# Patient Record
Sex: Male | Born: 1960 | Race: White | Hispanic: No | Marital: Married | Smoking: Current every day smoker
Health system: Southern US, Community
[De-identification: ages and names within clinical notes are randomized; demographics above are authoritative.]

## PROBLEM LIST (undated history)

## (undated) DIAGNOSIS — K589 Irritable bowel syndrome without diarrhea: Secondary | ICD-10-CM

## (undated) DIAGNOSIS — I10 Essential (primary) hypertension: Secondary | ICD-10-CM

## (undated) DIAGNOSIS — K635 Polyp of colon: Secondary | ICD-10-CM

## (undated) DIAGNOSIS — K219 Gastro-esophageal reflux disease without esophagitis: Secondary | ICD-10-CM

## (undated) HISTORY — DX: Gastro-esophageal reflux disease without esophagitis: K21.9

## (undated) HISTORY — DX: Essential (primary) hypertension: I10

## (undated) HISTORY — DX: Irritable bowel syndrome without diarrhea: K58.9

---

## 1998-10-15 ENCOUNTER — Inpatient Hospital Stay (HOSPITAL_COMMUNITY): Admission: EM | Admit: 1998-10-15 | Discharge: 1998-10-15 | Payer: Self-pay | Admitting: Emergency Medicine

## 1998-10-19 ENCOUNTER — Encounter: Admission: RE | Admit: 1998-10-19 | Discharge: 1998-10-19 | Payer: Self-pay | Admitting: Hematology and Oncology

## 1998-10-24 ENCOUNTER — Ambulatory Visit (HOSPITAL_COMMUNITY): Admission: RE | Admit: 1998-10-24 | Discharge: 1998-10-24 | Payer: Self-pay | Admitting: Internal Medicine

## 2005-07-24 ENCOUNTER — Ambulatory Visit: Payer: Self-pay | Admitting: Internal Medicine

## 2005-07-31 ENCOUNTER — Ambulatory Visit (HOSPITAL_COMMUNITY): Admission: RE | Admit: 2005-07-31 | Discharge: 2005-07-31 | Payer: Self-pay | Admitting: Internal Medicine

## 2005-07-31 ENCOUNTER — Encounter: Payer: Self-pay | Admitting: Internal Medicine

## 2005-07-31 ENCOUNTER — Ambulatory Visit: Payer: Self-pay | Admitting: Internal Medicine

## 2005-07-31 HISTORY — PX: COLONOSCOPY: SHX174

## 2005-07-31 HISTORY — PX: ESOPHAGOGASTRODUODENOSCOPY: SHX1529

## 2005-10-31 ENCOUNTER — Ambulatory Visit: Payer: Self-pay | Admitting: Internal Medicine

## 2005-11-14 ENCOUNTER — Encounter: Payer: Self-pay | Admitting: Emergency Medicine

## 2006-04-29 ENCOUNTER — Ambulatory Visit: Payer: Self-pay | Admitting: Internal Medicine

## 2007-05-16 ENCOUNTER — Ambulatory Visit: Payer: Self-pay | Admitting: Internal Medicine

## 2007-07-17 HISTORY — PX: KNEE SURGERY: SHX244

## 2008-03-01 ENCOUNTER — Emergency Department (HOSPITAL_COMMUNITY): Admission: EM | Admit: 2008-03-01 | Discharge: 2008-03-01 | Payer: Self-pay | Admitting: Emergency Medicine

## 2008-03-01 ENCOUNTER — Encounter: Payer: Self-pay | Admitting: Orthopedic Surgery

## 2008-03-03 ENCOUNTER — Ambulatory Visit: Payer: Self-pay | Admitting: Orthopedic Surgery

## 2008-03-03 DIAGNOSIS — S83509A Sprain of unspecified cruciate ligament of unspecified knee, initial encounter: Secondary | ICD-10-CM | POA: Insufficient documentation

## 2008-03-03 DIAGNOSIS — M25469 Effusion, unspecified knee: Secondary | ICD-10-CM

## 2008-03-04 ENCOUNTER — Telehealth: Payer: Self-pay | Admitting: Orthopedic Surgery

## 2008-03-08 ENCOUNTER — Encounter: Payer: Self-pay | Admitting: Orthopedic Surgery

## 2008-03-12 ENCOUNTER — Encounter: Payer: Self-pay | Admitting: Orthopedic Surgery

## 2008-03-17 ENCOUNTER — Ambulatory Visit: Payer: Self-pay | Admitting: Orthopedic Surgery

## 2008-03-17 DIAGNOSIS — M23302 Other meniscus derangements, unspecified lateral meniscus, unspecified knee: Secondary | ICD-10-CM | POA: Insufficient documentation

## 2008-03-18 ENCOUNTER — Telehealth: Payer: Self-pay | Admitting: Orthopedic Surgery

## 2008-03-18 ENCOUNTER — Encounter: Payer: Self-pay | Admitting: Orthopedic Surgery

## 2008-03-23 ENCOUNTER — Encounter: Payer: Self-pay | Admitting: Orthopedic Surgery

## 2008-03-23 ENCOUNTER — Telehealth: Payer: Self-pay | Admitting: Orthopedic Surgery

## 2008-03-26 ENCOUNTER — Ambulatory Visit: Payer: Self-pay | Admitting: Orthopedic Surgery

## 2008-03-26 ENCOUNTER — Ambulatory Visit (HOSPITAL_COMMUNITY): Admission: RE | Admit: 2008-03-26 | Discharge: 2008-03-26 | Payer: Self-pay | Admitting: Orthopedic Surgery

## 2008-03-30 ENCOUNTER — Ambulatory Visit: Payer: Self-pay | Admitting: Orthopedic Surgery

## 2008-04-05 ENCOUNTER — Encounter: Payer: Self-pay | Admitting: Orthopedic Surgery

## 2008-04-19 ENCOUNTER — Ambulatory Visit: Payer: Self-pay | Admitting: Orthopedic Surgery

## 2008-04-20 ENCOUNTER — Encounter: Payer: Self-pay | Admitting: Orthopedic Surgery

## 2008-04-20 ENCOUNTER — Encounter (HOSPITAL_COMMUNITY): Admission: RE | Admit: 2008-04-20 | Discharge: 2008-05-20 | Payer: Self-pay | Admitting: Orthopedic Surgery

## 2008-04-27 ENCOUNTER — Encounter: Payer: Self-pay | Admitting: Orthopedic Surgery

## 2008-05-06 ENCOUNTER — Telehealth: Payer: Self-pay | Admitting: Orthopedic Surgery

## 2008-05-07 ENCOUNTER — Encounter: Payer: Self-pay | Admitting: Orthopedic Surgery

## 2008-05-10 ENCOUNTER — Ambulatory Visit: Payer: Self-pay | Admitting: Orthopedic Surgery

## 2008-05-14 ENCOUNTER — Encounter: Payer: Self-pay | Admitting: Orthopedic Surgery

## 2008-06-03 ENCOUNTER — Encounter: Payer: Self-pay | Admitting: Orthopedic Surgery

## 2010-11-28 NOTE — Assessment & Plan Note (Signed)
NAMEMarland Flores  DELSIN, COPEN                  CHART#:  [id number]   DATE:  05/16/2007                       DOB:  1960/10/26   Followup gastroesophageal reflux disease, last seen 04/29/2006.  The  patient has done well.  He has been able to lose some weight.  He is now  down to 244.  He was 253 when he was seen originally 10/31/2005.  He has  basically stopped taking AcipHex and uses Tums rarely for occasional  reflux.  He has learned to avoid things like coffee, candy and cinnamon,  and he ends up doing very well.  He is avoiding late night meals.   He is not having any dysphagia.  He underwent a colonoscopy back in  January of 2007 for hematochezia.  Found to have some colonic adenomas,  which were removed.  He is due for surveillance in 2012.  He has not  seen Dr. Ouida Sills very recently.   CURRENT MEDICATIONS:  Altace 10 mg daily.   ALLERGIES:  Some type of pain medication.   EXAM:  Looks well.  Weight 244, height 6 feet 1 inch, temperature 97.9,  BP 140/90, pulse 64.  Skin warm and dry.  Chest and lungs, clear to  auscultation.  Cardiac exam is regular rate and rhythm without murmur,  gallop, or rub.  Abdomen with positive bowel sounds.  Soft and nontender  without palpable mass or organomegaly.   ASSESSMENT:  Gastroesophageal reflux disease.  Symptoms are now  quiescent with weight loss and dietary modification.  He is not on  aspiration therapy.  He is overall doing very well.  History of colonic  adenomas, due for surveillance 2012.   RECOMMENDATIONS:  I told Mr. Vandenberghe it would be great if he could lose  another 10 pounds over the next year.  This will help sustain a  remission in his reflux disease.  Also would help some blood pressure.  I have recommended he have an over-the-counter agent for occasional  reflux as needed on hand.  Otherwise, we do not need to do anything else  unless he has  recurrent problems.  I have put him on the computer calendar for a call  back in 2012 for a  colonoscopy.  No schedule followup need at this time  otherwise.       Jonathon Bellows, M.D.  Electronically Signed     RMR/MEDQ  D:  05/16/2007  T:  05/16/2007  Job:  [id number]   cc:   Kingsley Callander. Ouida Sills, MD

## 2010-11-28 NOTE — Op Note (Signed)
Shane Flores, Shane Flores                 ACCOUNT NO.:  [id number]   MEDICAL RECORD NO.:  [id number]          PATIENT TYPE:  AMB   LOCATION:  DAY                           FACILITY:  [facility]   PHYSICIAN:  Vickki Hearing, M.D.DATE OF BIRTH:  1961/03/05   DATE OF PROCEDURE:  DATE OF DISCHARGE:                               OPERATIVE REPORT   CHIEF COMPLAINT:  Pain in Shane Flores left knee.   HISTORY:  He is a 50 year old male who was injured at work on March 01, 2008, presented to my office on March 03, 2008.   MECHANISM OF INJURY:  He slipped on a step and knee popped and cracked  and had immediate swelling.  He was seen in the hospital, had x-rays.  They were negative.  He was treated with crutches, ice, ibuprofen, and  Tylenol.  I saw, he had a limp and a large effusion that was aspirated.  He was sent for an MRI.  Shane Flores MRI showed a torn medial meniscus, some  arthritis of the knee.   Shane Flores mechanical symptoms warranted recommendation of surgery.  He  accepted risks and benefits of procedure.   PREOPERATIVE DIAGNOSIS:  Torn medial meniscus, left knee.   POSTOPERATIVE DIAGNOSIS:  Torn medial meniscus, left knee.   PROCEDURES:  1. Arthroscopy, left knee.  2. Partial medial meniscectomy.   SURGEON:  Vickki Hearing, MD.   ASSISTANTS:  None.   ANESTHETIC:  General with LMA.   OPERATIVE FINDINGS:  He had a 2 bucket-handle tear of Shane Flores medial  meniscus with the posterior horn flipped, but still attached to its  meniscal anatomic sites.  It was flipped into the anterior portion and  notch.  ACL was intact.  Lateral meniscus intact.  Patellofemoral joint  had mild chondromalacia, grade 1.  Medial femoral condyle had mild  chondromalacia, grade 1.  Lateral compartment normal.   No specimens.   BLOOD LOSS:  Minimal.   COMPLICATIONS:  None.   COUNTS:  Correct.   The patient was identified preop in the holding area, site marking by  the patient and surgeon on the left knee.  History  and physical updated.  The patient was started on a gram of Ancef, brought to surgery, had LMA  anesthetic.  Left knee prep with DuraPrep, draped sterilely.   Time-out procedure completed.   The patient's knee injected with Marcaine with dilute epinephrine  solution.  Two portals were established, one medial, one lateral.  Scope  was placed lateral.  Diagnostic arthroscopy was completed.  The anterior  portion of the bucket-handle was detached from the remaining anterior  meniscus with a straight scissors punch.  The remaining meniscal  fragment was resected with a motorized shaver to the posterior portion.  This portion was then reduced back into the posterior horn and then  resected with a combination of duckbill forceps, motorized shaver, and  90-degree ArthroCare wand.   A probe was then placed in the joint.  Remaining meniscal rim was  stable.  ACL was palpated stable.  Lateral meniscus palpated stable.  Patellofemoral joint had mild  chondromalacia.   The knee was irrigated, injected with 20 mL of Marcaine with epinephrine  solution.  Portal sites closed with 3-0 nylon.  Dressings and Cryo/Cuff  applied.  The patient was extubated, taken to recovery room in stable  condition.   POSTOPERATIVE PLAN:  The patient will be discharged on Norco 7.5 mg/325  mg 1 q. 4 p.r.n. for pain.  We will give him some Phenergan in case he  gets nauseous 25 mg p.o. q. 6 p.r.n. for pain.  We will give 60 tablets  of Norco with 2 refills and 40 tablets of Phenergan, no refills.  The  patient will follow up with me on Tuesday, March 30, 2008 at 2 p.m.      Vickki Hearing, M.D.  Electronically Signed     SEH/MEDQ  D:  03/26/2008  T:  03/26/2008  Job:  [id number]

## 2010-11-28 NOTE — H&P (Signed)
Shane Flores, Shane Flores                 ACCOUNT NO.:  [id number]   MEDICAL RECORD NO.:  [id number]          PATIENT TYPE:  AMB   LOCATION:  DAY                           FACILITY:  [facility]   PHYSICIAN:  Vickki Hearing, M.D.DATE OF BIRTH:  19-Feb-1961   DATE OF ADMISSION:  DATE OF DISCHARGE:  LH                              HISTORY & PHYSICAL   CHIEF COMPLAINT:  Pain, left knee.   HISTORY:  This patient was injured at work.  He is 50 years old.  He  slipped on a step.  His knee popped and cracked and he had immediate  swelling.  He went to the hospital for x-rays and they were negative.  He was treated with crutches, ice, ibuprofen and Tylenol.   He presented to [place] on August 19 with the date of injuries noted as  March 01, 2008.  He has no previous surgery on his knee.  No previous  symptoms.  He was walking at that time with a limp and a large joint  effusion.   We went ahead and sent him for an MRI.  The MRI was done and showed that  he had a torn medial meniscus.  There was some arthritis in the knee  which is unrelated to his acute injury.  Additional findings which are  chronic are quadriceps and patellar tendinosis, patellofemoral  chondromalacia, chondromalacia of the posterior medial tibial plateau.  The bucket handle tear appears to be acute.   ALLERGIES:  He has no known allergies.   FAMILY HISTORY:  He has a family history of coronary artery disease,  asthma.   SOCIAL HISTORY:  He is married.  He smokes half pack of cigarettes per  day.  He drinks two cups of coffee per day.  No alcohol use.   REVIEW OF SYSTEMS:  His review of systems is consistent with the  following:  Negative findings under general, cardiac, respiratory, GI,  GU, neuro, endocrine, psych, derm, ENT, immunologic and lymphatic.   PHYSICAL EXAMINATION:  VITAL SIGNS:  Vital signs are stable.  Development is normal.  Nutrition and grooming are normal.  Body habitus  mesomorphic.  Deformities none.  CARDIOVASCULAR:  Observation and palpation normal.  LYMPH NODES:  Normal.  SKIN:  Normal to palpation and inspection.  NEURO:  Coordination normal.  Reflexes normal.  Sensation normal.  PSYCHOLOGICAL:  Alert and oriented x3.  Mood normal.  Affect normal.  MUSCULOSKELETAL:  Gait - abnormal gait pattern consistent with injury as  he was using crutches and not bearing weight initially.  He had a large  knee joint effusion, peripheral joint line tenderness along the medial  side.  There was some mild laxity in the ACL but there was a firm  endpoint and no pivot shift.  Collaterals were stable.  Meniscal signs  eventually became demonstrable as positive with McMurray's maneuver.  His range of motion improved from 45 degrees on initial presentation to  over 100 degrees on final presentation before surgery.   DIAGNOSIS:  Torn medial meniscus, derangement meniscus, 717.5.  MRI was  done at [facility].  MRI reviewed.  Torn bucket handle medial meniscus,  osteoarthritis noted.   ASSESSMENT:  Torn medial meniscus, left knee.   PLAN:  Arthroscopy, left knee, partial medial meniscectomy.   Informed consent process.  I have discussed the procedure with the  patient.  I answered his questions.  Risk of bleeding, infection,  neurovascular injury were discussed.  Diagnosis and reason for surgery  has been explained as a torn medial meniscus with underlying arthritis.  Underlying arthritis has been explained as nonacute and meniscal tear as  acute.  The patient demonstrated understanding of this discussion.  Specific to this procedure, risks include pain, stiffness, swelling.  Surgery on March 26, 2008.  Worker[employer] Astr[employer] has  approved this surgery.  The patient is out of work until October 12.   The patient's followup will be Tuesday, March 30, 2008, at 2 p.m.      Vickki Hearing, M.D.  Electronically Signed     SEH/MEDQ  D:  03/25/2008  T:  03/25/2008  Job:  [id number]

## 2010-12-01 NOTE — Op Note (Signed)
NAMEEDMUND, HOLCOMB                 ACCOUNT NO.:  [id number]   MEDICAL RECORD NO.:  [id number]          PATIENT TYPE:  AMB   LOCATION:  DAY                           FACILITY:  [facility]   PHYSICIAN:  R. Roetta Sessions, M.D. DATE OF BIRTH:  1961/01/14   DATE OF PROCEDURE:  07/31/2005  DATE OF DISCHARGE:                                 OPERATIVE REPORT   PROCEDURE:  Esophagogastroduodenoscopy with biopsy followed by colonoscopy  and snare polypectomy.   INDICATIONS FOR PROCEDURE:  The patient is a 50 year old gentleman with  Hemoccult positive stool on digital rectal exam. Also has had hematochezia  at least one episode. Also having chronic gastroesophageal reflux disease  symptoms. EGD and colonoscopy are now being done. This approach has been  discussed with the patient at length. Potential risks, benefits, and  alternatives have been reviewed.   There is no family history of colorectal neoplasia.   INSTRUMENT:  Olympus video chip system.   Conscious sedation with IV Versed and Demerol in incremental doses.   FINDINGS:  Examination of the tubular esophagus revealed no mucosal  abnormalities. EG junction easily traversed.   Stomach:  Gastric cavity was empty and insufflated well with air. Thorough  examination of gastric mucosa including retroflexed view of the proximal  stomach and esophagogastric junction demonstrated no abnormalities. Pylorus  patent and easily traversed. Examination of bulb and second portion revealed  multiple posterior bulbar erosions straddling the junction between D1 and  D2. There was no frank ulcer crater seen.   THERAPEUTIC/DIAGNOSTIC MANEUVERS:  Esophageal mucosa was biopsied midway and  distally for histological study.   The patient tolerated the procedure well and was prepared for colonoscopy.  Digital rectal revealed no abnormalities.   ENDOSCOPIC FINDINGS:  Prep was adequate   Rectum:  Examination of the rectal mucosa including retroflexed view  of the  anal verge revealed no abnormalities aside from some minimal anal  hemorrhoids.   Colon:  Colonic mucosa was surveyed from the rectosigmoid junction through  the left, transverse, and right colon to the area of the appendiceal  orifice, ileocecal valve, and cecum. These structures were well seen and  photographed for the record. From this level, the scope was slowly  withdrawn, and all previously mentioned mucosal surfaces were again seen.  Once again, the patient had a 1-cm pedunculated polyp at 35 cm. There was a  second 7-mm polyp at 25 cm. The remainder of the colonic mucosa appeared  normal. Both of these polyps were resected with snare cautery and recovered  through the scope. The patient tolerated both procedures well and was  reactive to endoscopy.   IMPRESSION:  Esophagogastroduodenoscopy:  Normal esophageal mucosa status  post biopsy. Normal stomach. Bulbar and proximal D2 erosions. Otherwise  normal D1 and D2.   Colonoscopy findings:  Anal canal hemorrhoids. Polyps at 20 and 35 cm,  removed with snare cautery. The remainder of the colonic mucosa appeared  normal. I suspect the patient has bled frankly from hemorrhoids although the  polyp at 35 cm could conceivably have produced Hemoccult-positive stool.   RECOMMENDATIONS:  1.  No aspirin or arthritis medications for the next 10 days.  2.  Gastroesophageal reflux disease literature provided to Mr. Lafauci.  3.  Three-month course of Aciphex 20 mg orally daily. He should take the      medication every day. Hemorrhoid literature provided to Mr. Galan.  4.  No aspirin or arthritis medications for the next 10 days.  5.  Follow up on pathology.  6.  Ten-day course of  Anusol suppositories one per rectum at bedtime.  7.  Further recommendations to follow.      Jonathon Bellows, M.D.  Electronically Signed     RMR/MEDQ  D:  07/31/2005  T:  07/31/2005  Job:  [id number]   cc:   Kingsley Callander. Ouida Sills, MD  Fax: [phone]

## 2010-12-01 NOTE — Consult Note (Signed)
Shane Flores                 ACCOUNT NO.:  [id number]   MEDICAL RECORD NO.:  [id number]          PATIENT TYPE:   LOCATION:                                 FACILITY:   PHYSICIAN:  Lionel December, M.D.    DATE OF BIRTH:  1960/08/09   DATE OF CONSULTATION:  07/24/2005  DATE OF DISCHARGE:                                   CONSULTATION   REFERRING PHYSICIAN:  Kingsley Callander. Ouida Sills, M.D.   REASON FOR CONSULTATION:  Heme positive stool and chronic GERD.   HISTORY OF PRESENT ILLNESS:  Shane Flores is a 50 year old, Caucasian gentleman who  presented today for further evaluation of Hemoccult positive stool and  gastroesophageal reflux disease.  He recently had his yearly physical with  Dr. Ouida Sills.  On rectal examination, he was found to have heme positive stool.  His bowel movements are usually regular.  Recently, he did note bright red  blood per rectum on the toilet tissue.  He has had intermittent epigastric  pain as well as heartburn.  He has a history of gastroesophageal reflux  disease of over 5 years.  Previously, he had been on Nexium, but it seemed  to not work more recently.  Currently, he is not on anything.  He is  watching is diet closely and takes occasional Tums.  He denies any dysphagia  or odynophagia.  His weight is up 25 pounds and he is trying to lose some  weight.  He has never had a colonoscopy or EGD.   CURRENT MEDICATIONS:  Altace 10 mg daily.   ALLERGIES:  No known drug allergies.   PAST MEDICAL HISTORY:  1.  Gastroesophageal reflux disease.  2.  Hypertension.  3.  Irritable bowel syndrome .  4.  Hemorrhoids.   FAMILY HISTORY:  Mother has hypertension.  Father is deceased at age 51 and  had an MI.  No family history of colorectal cancer or chronic GI illness.   SOCIAL HISTORY:  He is married and has four children.  He is employed with  [employer] as a Naval architect.  He smokes 2-3 cigarettes daily.  Denies any  alcohol use.   REVIEW OF SYSTEMS:  GASTROINTESTINAL:   See HPI.  CONSTITUTIONAL:  Weight  gain as outlined above.  CARDIOPULMONARY:  No chest pain or shortness of  breath.   PHYSICAL EXAMINATION:  VITAL SIGNS:  Weight 255.5 pounds, height 6 feet.  Temperature 98, blood pressure 132/96, pulse 84.  GENERAL:  Pleasant, obese, Caucasian male in no acute distress.  SKIN:  Warm and dry, no jaundice.  HEENT:  Pupils equal round and reactive to light.  Conjunctivae are pink.  Sclerae nonicteric.  Oropharyngeal mucosa are moist and pink.  No lesions,  erythema or exudate.  No lymphadenopathy or thyromegaly.  CHEST:  Lungs clear to auscultation.  CARDIAC:  Regular rate and rhythm with normal S1, S2.  No murmurs, rubs or  gallops.  ABDOMEN:  Positive bowel sounds, obese, but symmetrical.  Soft.  Mild  epigastric tenderness to deep palpation.  No organomegaly or masses.  No  rebound tenderness  or guarding.  No abdominal bruits or hernias.  EXTREMITIES:  No edema.   IMPRESSION:  Shane Flores is a 50 year old, Caucasian gentleman with recent  Hemoccult positive stool on digital rectal examination.  He has since had  one episode of bright red blood per rectum.  Suspect rectal bleeding due to  benign rectoanal source such as anal fissure or hemorrhoids.  He also has  chronic gastroesophageal reflux disease and has intermittent heartburn and  epigastric pain.  He has never had an esophagogastroduodenoscopy.   RECOMMENDATIONS:  1.  I recommend colonoscopy and EGD at this time for further evaluation of      heme positive stool and to rule out complications of chronic GERD such      as Barrett's esophagus.  2.  Colonoscopy and EGD in the near future.  3.  Further recommendations to follow.   I would like to thank Dr. Carylon Perches for allowing [place] to take part in the care  of this patient.      Tana Coast, P.A.      Lionel December, M.D.  Electronically Signed    LL/MEDQ  D:  07/24/2005  T:  07/24/2005  Job:  [id number]

## 2011-04-05 ENCOUNTER — Encounter: Payer: Self-pay | Admitting: Internal Medicine

## 2011-04-18 LAB — BASIC METABOLIC PANEL
BUN: 10
CO2: 31
Calcium: 9.2
Chloride: 101
Creatinine, Ser: 0.91
GFR calc Af Amer: >60
GFR calc non Af Amer: >60
Glucose, Bld: 95
Potassium: 4.2
Sodium: 137

## 2011-04-18 LAB — HEMOGLOBIN AND HEMATOCRIT, BLOOD: HCT: 49.8

## 2011-07-18 ENCOUNTER — Encounter: Payer: Self-pay | Admitting: Internal Medicine

## 2011-07-24 ENCOUNTER — Telehealth: Payer: Self-pay

## 2011-07-24 NOTE — Telephone Encounter (Signed)
Pt referred from Dr. Ouida Sills for screening colonoscopy. He will be 51 on [birth year]06/2010. He wants to schedule after that. I told him I will call him back end of Jan or 1st of Feb to schedule after his birthday. He said that is fine. He is on my call back list.

## 2011-08-07 ENCOUNTER — Telehealth: Payer: Self-pay

## 2011-08-07 NOTE — Telephone Encounter (Signed)
LMOM to call. Needs OV prior to colonoscopy. Hx adenomas) Pt wants colonoscopy scheduled after his birthday.

## 2011-08-13 ENCOUNTER — Telehealth: Payer: Self-pay

## 2011-08-13 NOTE — Telephone Encounter (Signed)
Called [phone] and was told this person does not live at that number. Will mail a letter for pt to call.

## 2011-08-13 NOTE — Telephone Encounter (Signed)
Called emergency contact [phone] and left message on machine for pt to call. Mailed letter to call.

## 2011-08-21 ENCOUNTER — Encounter: Payer: Self-pay | Admitting: Internal Medicine

## 2011-08-23 ENCOUNTER — Encounter: Payer: Self-pay | Admitting: Gastroenterology

## 2011-08-23 ENCOUNTER — Ambulatory Visit (INDEPENDENT_AMBULATORY_CARE_PROVIDER_SITE_OTHER): Payer: BC Managed Care – PPO | Admitting: Gastroenterology

## 2011-08-23 VITALS — BP 133/75 | HR 105 | Temp 98.2°F | Ht 73.0 in | Wt 262.0 lb

## 2011-08-23 DIAGNOSIS — Z860100 Personal history of colon polyps, unspecified: Secondary | ICD-10-CM | POA: Insufficient documentation

## 2011-08-23 DIAGNOSIS — D369 Benign neoplasm, unspecified site: Secondary | ICD-10-CM | POA: Insufficient documentation

## 2011-08-23 MED ORDER — PEG-KCL-NACL-NASULF-NA ASC-C 100 G PO SOLR: 1.0000 | Freq: Once | ORAL | Status: DC

## 2011-08-23 NOTE — Patient Instructions (Signed)
We have set you up for a colonoscopy with Dr. Jena Gauss.  Further recommendations to follow once this is completed.   HAPPY BIRTHDAY!!!!!

## 2011-08-23 NOTE — Assessment & Plan Note (Signed)
51 year old with hx of tubular adenomas in 2007, due for surveillance colonoscopy. No lower GI symptoms at all. Doing well, agreeable to proceed with procedure in near future.  Proceed with TCS with Dr. Jena Gauss in near future: the risks, benefits, and alternatives have been discussed with the patient in detail. The patient states understanding and desires to proceed.

## 2011-08-23 NOTE — Progress Notes (Signed)
Primary Care Physician:  Shane Perches, MD, MD Primary Gastroenterologist:  Dr. Jena Gauss  Chief Complaint  Patient presents with  . Colonoscopy    HPI:   Shane Flores presents today for a visit prior to surveillance colonoscopy. Hx of adenomatous polyps in 2007, due for repeat now. Denies any abdominal pain, rectal bleeding, loss of appetite, weight loss, change in bowel habits. Has no upper GI symptoms. Very rare reflux if at all. He has been watching what he eats. No dysphagia.  EGD on file from 2007 as outlined below.   Past Medical History  Diagnosis Date  . GERD (gastroesophageal reflux disease)   . HTN (hypertension)   . IBS (irritable bowel syndrome)   . Hemorrhoids     Past Surgical History  Procedure Date  . Colonoscopy 07/31/05    anal canal hemorrhoids, tubular adenomas  . Esophagogastroduodenoscopy 07/31/05    normal esophagus, bulbar and proximal D2 erosions, , path: reflux esophagitis  . Knee surgery 2009    left    Current Outpatient Prescriptions  Medication Sig Dispense Refill  . peg 3350 powder (MOVIPREP) 100 G SOLR Take 1 kit (100 g total) by mouth once. As directed Please purchase 1 Fleets enema to use with the prep  1 kit  0    Allergies as of 08/23/2011  . (No Known Allergies)    Family History  Problem Relation Age of Onset  . Colon cancer Neg Hx     History   Social History  . Marital Status: Married    Spouse Name: N/A    Number of Children: N/A  . Years of Education: N/A   Occupational History  . [employer]    Social History Main Topics  . Smoking status: Current Everyday Smoker -- 1.0 packs/day    Types: Cigarettes  . Smokeless tobacco: Not on file  . Alcohol Use: No  . Drug Use: No  . Sexually Active: Not on file   Other Topics Concern  . Not on file   Social History Narrative  . No narrative on file    Review of Systems: Gen: Denies any fever, chills, fatigue, weight loss, lack of appetite.  CV: Denies chest pain, heart  palpitations, peripheral edema, syncope.  Resp: Denies shortness of breath at rest or with exertion. Denies wheezing or cough.  GI: Denies dysphagia or odynophagia. Denies jaundice, hematemesis, fecal incontinence. GU : Denies urinary burning, urinary frequency, urinary hesitancy MS: Denies joint pain, muscle weakness, cramps, or limitation of movement.  Derm: Denies rash, itching, dry skin Psych: Denies depression, anxiety, memory loss, and confusion Heme: Denies bruising, bleeding, and enlarged lymph nodes.  Physical Exam: BP 133/75  Pulse 105  Temp(Src) 98.2 F (36.8 C) (Temporal)  Ht 6\' 1"  (1.854 m)  Wt 262 lb (118.842 kg)  BMI 34.57 kg/m2 General:   Alert and oriented. Pleasant and cooperative. Well-nourished and well-developed.  Head:  Normocephalic and atraumatic. Eyes:  Without icterus, sclera clear and conjunctiva pink.  Ears:  Normal auditory acuity. Nose:  No deformity, discharge,  or lesions. Mouth:  No deformity or lesions, oral mucosa pink.  Neck:  Supple, without mass or thyromegaly. Lungs:  Clear to auscultation bilaterally. No wheezes, rales, or rhonchi. No distress.  Heart:  S1, S2 present without murmurs appreciated.  Abdomen:  +BS, soft, non-tender and non-distended. No HSM noted. No guarding or rebound. No masses appreciated.  Rectal:  Deferred  Msk:  Symmetrical without gross deformities. Normal posture. Pulses:  Normal pulses noted.  Extremities:  Without clubbing or edema. Neurologic:  Alert and  oriented x4;  grossly normal neurologically. Skin:  Intact without significant lesions or rashes. Cervical Nodes:  No significant cervical adenopathy. Psych:  Alert and cooperative. Normal mood and affect.

## 2011-08-24 NOTE — Progress Notes (Signed)
Faxed to PCP

## 2011-09-10 ENCOUNTER — Encounter (HOSPITAL_COMMUNITY): Payer: Self-pay | Admitting: Pharmacy Technician

## 2011-09-11 MED ORDER — SODIUM CHLORIDE 0.45 % IV SOLN
Freq: Once | INTRAVENOUS | Status: AC
  Administered 2011-09-12: 08:00:00 via INTRAVENOUS

## 2011-09-12 ENCOUNTER — Ambulatory Visit (HOSPITAL_COMMUNITY)
Admission: RE | Admit: 2011-09-12 | Discharge: 2011-09-12 | Disposition: A | Discharge status: Home / Self Care | Payer: BC Managed Care – PPO | Source: Ambulatory Visit | Attending: Internal Medicine | Admitting: Internal Medicine

## 2011-09-12 ENCOUNTER — Encounter (HOSPITAL_COMMUNITY)
Admission: RE | Disposition: A | Discharge status: Home / Self Care | Payer: Self-pay | Source: Ambulatory Visit | Attending: Internal Medicine

## 2011-09-12 ENCOUNTER — Encounter (HOSPITAL_COMMUNITY): Payer: Self-pay | Admitting: *Deleted

## 2011-09-12 DIAGNOSIS — D126 Benign neoplasm of colon, unspecified: Secondary | ICD-10-CM

## 2011-09-12 DIAGNOSIS — Z1211 Encounter for screening for malignant neoplasm of colon: Secondary | ICD-10-CM

## 2011-09-12 DIAGNOSIS — K5730 Diverticulosis of large intestine without perforation or abscess without bleeding: Secondary | ICD-10-CM | POA: Insufficient documentation

## 2011-09-12 DIAGNOSIS — Z860100 Personal history of colon polyps, unspecified: Secondary | ICD-10-CM

## 2011-09-12 DIAGNOSIS — Z79899 Other long term (current) drug therapy: Secondary | ICD-10-CM | POA: Insufficient documentation

## 2011-09-12 DIAGNOSIS — I10 Essential (primary) hypertension: Secondary | ICD-10-CM | POA: Insufficient documentation

## 2011-09-12 HISTORY — PX: COLONOSCOPY: SHX5424

## 2011-09-12 HISTORY — DX: Polyp of colon: K63.5

## 2011-09-12 SURGERY — COLONOSCOPY
Anesthesia: Moderate Sedation

## 2011-09-12 MED ORDER — MEPERIDINE HCL 100 MG/ML IJ SOLN
INTRAMUSCULAR | Status: AC
  Filled 2011-09-12: qty 2

## 2011-09-12 MED ORDER — STERILE WATER FOR IRRIGATION IR SOLN
Status: DC | PRN
  Administered 2011-09-12: 08:00:00

## 2011-09-12 MED ORDER — MEPERIDINE HCL 100 MG/ML IJ SOLN
INTRAMUSCULAR | Status: DC | PRN
  Administered 2011-09-12 (×2): 25 mg via INTRAVENOUS
  Administered 2011-09-12: 50 mg via INTRAVENOUS

## 2011-09-12 MED ORDER — MIDAZOLAM HCL 5 MG/5ML IJ SOLN
INTRAMUSCULAR | Status: DC | PRN
  Administered 2011-09-12 (×3): 1 mg via INTRAVENOUS
  Administered 2011-09-12: 2 mg via INTRAVENOUS

## 2011-09-12 MED ORDER — MIDAZOLAM HCL 5 MG/5ML IJ SOLN
INTRAMUSCULAR | Status: AC
  Filled 2011-09-12: qty 10

## 2011-09-12 NOTE — Op Note (Signed)
[facility] [address] [place], [place]  [address]  COLONOSCOPY PROCEDURE REPORT  PATIENT:  Shane Flores, Shane Flores  MR#:  [id number] BIRTHDATE:  [birth year], 51 yrs. old  GENDER:  male ENDOSCOPIST:  R. Roetta Sessions, MD FACP [facility] REFFERRED   Dr. Carylon Perches PROCEDURE DATE:  09/12/2011 PROCEDURE:  Colonoscopy with polyp ablation  INDICATIONS:  Surveillance colonoscopy; history of colonic adenoma  INFORMED CONSENT:  The risks, benefits, alternatives and imponderables including but not limited to bleeding, perforation as well as the possibility of a missed lesion have been reviewed. The potential for biopsy, lesion removal, etc. have also been discussed.  Questions have been answered.  All parties agreeable. Please see the history and physical in the medical record for more information.  MEDICATIONS  IV Versed and Demerol in incremental doses  DESCRIPTION OF PROCEDURE:  After a digital rectal exam was performed, the EC-[id number] ([id number]) colonoscope was advanced from the anus through the rectum and colon to the area of the cecum, ileocecal valve and appendiceal orifice.  The cecum was deeply intubated.  These structures were well-seen and photographed for the record.  From the level of the cecum and ileocecal valve, the scope was slowly and cautiously withdrawn.  The mucosal surfaces were carefully surveyed utilizing scope tip deflection to facilitate fold flattening as needed.  The scope was pulled down into the rectum where a thorough examination including retroflexion was performed. <<PROCEDUREIMAGES>>  FINDINGS:  Adequate preparation. Normal rectum. Shallow sigmoid diverticula; single 4 mm polyp  at the ileocecal valve; remainder of colonic                       mucosa appeared normal.  THERAPEUTIC / DIAGNOSTIC MANEUVERS PERFORMED:  The single polyp mentioned above was removed/ablated with one pass of the cold snare.  No specimen for the pathologist  COMPLICATIONS:   None  CECAL WITHDRAWAL TIME: 11 minutes  IMPRESSION: Colonic diverticulosis-sigmoid. Single diminutive polyp at ileocecal valve removed/ablated as described above  RECOMMENDATIONS:   Return for surveillance colonoscopy in 5 years.  ______________________________ R. Roetta Sessions, MD Caleen Essex  CC:  n. eSIGNED:   R. Roetta Sessions at 09/12/2011 09:00 AM  Foye Clock,   [phone]

## 2011-09-12 NOTE — H&P (View-Only) (Signed)
 Primary Care Physician:  FAGAN,ROY, MD, MD Primary Gastroenterologist:  Dr. Rourk  Chief Complaint  Patient presents with  . Colonoscopy    HPI:   Shane Flores presents today for a visit prior to surveillance colonoscopy. Hx of adenomatous polyps in 2007, due for repeat now. Denies any abdominal pain, rectal bleeding, loss of appetite, weight loss, change in bowel habits. Has no upper GI symptoms. Very rare reflux if at all. He has been watching what he eats. No dysphagia.  EGD on file from 2007 as outlined below.   Past Medical History  Diagnosis Date  . GERD (gastroesophageal reflux disease)   . HTN (hypertension)   . IBS (irritable bowel syndrome)   . Hemorrhoids     Past Surgical History  Procedure Date  . Colonoscopy 07/31/05    anal canal hemorrhoids, tubular adenomas  . Esophagogastroduodenoscopy 07/31/05    normal esophagus, bulbar and proximal D2 erosions, , path: reflux esophagitis  . Knee surgery 2009    left    Current Outpatient Prescriptions  Medication Sig Dispense Refill  . peg 3350 powder (MOVIPREP) 100 G SOLR Take 1 kit (100 g total) by mouth once. As directed Please purchase 1 Fleets enema to use with the prep  1 kit  0    Allergies as of 08/23/2011  . (No Known Allergies)    Family History  Problem Relation Age of Onset  . Colon cancer Neg Hx     History   Social History  . Marital Status: Married    Spouse Name: N/A    Number of Children: N/A  . Years of Education: N/A   Occupational History  . [employer]    Social History Main Topics  . Smoking status: Current Everyday Smoker -- 1.0 packs/day    Types: Cigarettes  . Smokeless tobacco: Not on file  . Alcohol Use: No  . Drug Use: No  . Sexually Active: Not on file   Other Topics Concern  . Not on file   Social History Narrative  . No narrative on file    Review of Systems: Gen: Denies any fever, chills, fatigue, weight loss, lack of appetite.  CV: Denies chest pain, heart  palpitations, peripheral edema, syncope.  Resp: Denies shortness of breath at rest or with exertion. Denies wheezing or cough.  GI: Denies dysphagia or odynophagia. Denies jaundice, hematemesis, fecal incontinence. GU : Denies urinary burning, urinary frequency, urinary hesitancy MS: Denies joint pain, muscle weakness, cramps, or limitation of movement.  Derm: Denies rash, itching, dry skin Psych: Denies depression, anxiety, memory loss, and confusion Heme: Denies bruising, bleeding, and enlarged lymph nodes.  Physical Exam: BP 133/75  Pulse 105  Temp(Src) 98.2 F (36.8 C) (Temporal)  Ht 6' 1" (1.854 m)  Wt 262 lb (118.842 kg)  BMI 34.57 kg/m2 General:   Alert and oriented. Pleasant and cooperative. Well-nourished and well-developed.  Head:  Normocephalic and atraumatic. Eyes:  Without icterus, sclera clear and conjunctiva pink.  Ears:  Normal auditory acuity. Nose:  No deformity, discharge,  or lesions. Mouth:  No deformity or lesions, oral mucosa pink.  Neck:  Supple, without mass or thyromegaly. Lungs:  Clear to auscultation bilaterally. No wheezes, rales, or rhonchi. No distress.  Heart:  S1, S2 present without murmurs appreciated.  Abdomen:  +BS, soft, non-tender and non-distended. No HSM noted. No guarding or rebound. No masses appreciated.  Rectal:  Deferred  Msk:  Symmetrical without gross deformities. Normal posture. Pulses:  Normal pulses noted.   Extremities:  Without clubbing or edema. Neurologic:  Alert and  oriented x4;  grossly normal neurologically. Skin:  Intact without significant lesions or rashes. Cervical Nodes:  No significant cervical adenopathy. Psych:  Alert and cooperative. Normal mood and affect.    

## 2011-09-12 NOTE — Interval H&P Note (Signed)
History and Physical Interval Note:  09/12/2011 8:18 AM  Shane Flores  has presented today for surgery, with the diagnosis of hx of polyps  The various methods of treatment have been discussed with the patient and family. After consideration of risks, benefits and other options for treatment, the patient has consented to  Procedure(s) (LRB): COLONOSCOPY (N/A) as a surgical intervention .  The patients' history has been reviewed, patient examined, no change in status, stable for surgery.  I have reviewed the patients' chart and labs.  Questions were answered to the patient's satisfaction.     Eula Listen

## 2011-09-18 ENCOUNTER — Encounter (HOSPITAL_COMMUNITY): Payer: Self-pay | Admitting: Internal Medicine

## 2015-09-22 ENCOUNTER — Other Ambulatory Visit (HOSPITAL_COMMUNITY): Payer: Self-pay | Admitting: Internal Medicine

## 2015-09-22 DIAGNOSIS — M5412 Radiculopathy, cervical region: Secondary | ICD-10-CM

## 2015-09-30 ENCOUNTER — Ambulatory Visit (HOSPITAL_COMMUNITY)
Admission: RE | Admit: 2015-09-30 | Discharge: 2015-09-30 | Disposition: A | Discharge status: Home / Self Care | Payer: Commercial Managed Care - PPO | Source: Ambulatory Visit | Attending: Internal Medicine | Admitting: Internal Medicine

## 2015-09-30 DIAGNOSIS — M5412 Radiculopathy, cervical region: Secondary | ICD-10-CM | POA: Diagnosis present

## 2015-09-30 DIAGNOSIS — M4802 Spinal stenosis, cervical region: Secondary | ICD-10-CM | POA: Diagnosis not present

## 2015-09-30 DIAGNOSIS — M542 Cervicalgia: Secondary | ICD-10-CM | POA: Diagnosis present

## 2015-11-07 ENCOUNTER — Ambulatory Visit (HOSPITAL_COMMUNITY): Payer: Commercial Managed Care - PPO | Attending: Neurosurgery | Admitting: Physical Therapy

## 2015-11-07 DIAGNOSIS — R293 Abnormal posture: Secondary | ICD-10-CM | POA: Diagnosis present

## 2015-11-07 DIAGNOSIS — M6281 Muscle weakness (generalized): Secondary | ICD-10-CM

## 2015-11-07 DIAGNOSIS — M542 Cervicalgia: Secondary | ICD-10-CM | POA: Diagnosis not present

## 2015-11-07 NOTE — Therapy (Signed)
[facility] [place], [place], [address] Phone: [phone]   Fax:  [phone]  Physical Therapy Evaluation  Patient Details  Name: Shane Flores MRN: [id number] Date of Birth: 1961-01-26 Referring Provider: Kary Kos, MD  Encounter Date: 11/07/2015      PT End of Session - 11/07/15 1119    Visit Number 1   Number of Visits 12   Date for PT Re-Evaluation 11/28/15   Authorization Type [employer] UMR   Authorization Time Period 12/19/2015   PT Start Time 1032   PT Stop Time 1112   PT Time Calculation (min) 40 min   Activity Tolerance Patient tolerated treatment well   Behavior During Therapy [facility] for tasks assessed/performed      Past Medical History  Diagnosis Date  . GERD (gastroesophageal reflux disease)   . HTN (hypertension)   . IBS (irritable bowel syndrome)   . Hemorrhoids   . Colon polyps     Past Surgical History  Procedure Laterality Date  . Colonoscopy  07/31/05    anal canal hemorrhoids, tubular adenomas  . Esophagogastroduodenoscopy  07/31/05    normal esophagus, bulbar and proximal D2 erosions, , path: reflux esophagitis  . Knee surgery  2009    left  . Colonoscopy  09/12/2011    Procedure: COLONOSCOPY;  Surgeon: Daneil Dolin, MD;  Location: [facility];  Service: Endoscopy;  Laterality: N/A;  9:00    There were no vitals filed for this visit.       Subjective Assessment - 11/07/15 1037    Subjective For about 3 weeks, when I look up I notice that my L UE goes numb.  MRI was completed, and sent to a specialists.  C6-7 pushing on spinal cord. Had bursitis and tendonitis in R shoulder years ago. Pt uses a cream - Deep Blue, and he states that the pain virtually goes away. Pt does a lot of heavy lifting for work, and is hurinting afterwards.     Pertinent History Has had right shoulder bursitis/tendonitis in the past   Limitations Other (comment)  Driving, or looking up   Currently in Pain? Yes   Pain Score 5    Pain Location --  and radiates to the L shoulder   Pain Orientation Left   Pain Descriptors / Indicators Throbbing   Pain Type Acute pain   Pain Onset 1 to 4 weeks ago   Aggravating Factors  looking up,    Pain Relieving Factors looking down, and cream   Effect of Pain on Daily Activities Has been given lighter work due to requirements for heavy lifting at work.    Multiple Pain Sites No            [facility] PT Assessment - 11/07/15 0001    Assessment   Medical Diagnosis cervicalgia   Referring Provider Kary Kos, MD   Onset Date/Surgical Date --  3 weeks ago   Next MD Visit --  May 2017 with Dr. Saintclair Halsted   Balance Screen   Has the patient fallen in the past 6 months Yes   How many times? 1  Cow pulled him down   Has the patient had a decrease in activity level because of a fear of falling?  No   Is the patient reluctant to leave their home because of a fear of falling?  No   Prior Function   Level of Independence Independent   Vocation Full time employment  Vocation Requirements --  Heavy lifting   Leisure --  hunting   Observation/Other Assessments   Observations WNL - no deficits when tested.    Observation/Other Assessments-Edema    Edema --  Vertebral artery test (-)   Posture/Postural Control   Posture/Postural Control --  fwd head, with rounded shoulders, increased lordosis, and ky   AROM   Overall AROM  --  Noted some scapular winging with B shoulder elevation    Right/Left Shoulder Right;Left   Right Shoulder Flexion --  wfl   Right Shoulder ABduction --  [facility]   Right Shoulder Internal Rotation --  T10   Right Shoulder External Rotation --  T2   Left Shoulder Flexion --  wfl   Left Shoulder ABduction --  wfl   Left Shoulder Internal Rotation --  T10   Left Shoulder External Rotation --  T2   Cervical Flexion 73   Cervical Extension 11   Cervical - Right Side Bend 37  shoulder hiking   Cervical - Left Side Bend 38  shoulder hiking    Cervical - Right Rotation 76   Cervical - Left Rotation 60   Strength   Overall Strength --  Grip strength is equal   Cervical Flexion 5/5   Cervical Extension 5/5   Cervical - Right Side Bend 4+/5   Cervical - Left Side Bend 4+/5   Palpation   Palpation comment --  30% impaired L scapula, and neck   Special Tests    Special Tests Cervical   Cervical Tests Dictraction  distraction feels good,compression                           PT Education - 11/07/15 1115    Education provided Yes   Education Details Pt educated on posture, and muscle length tension relationship, prognosis, HEP, and POC.    Person(s) Educated Patient   Methods Explanation;Demonstration;Tactile cues;Verbal cues;Handout   Comprehension Returned demonstration;Verbalized understanding;Tactile cues required;Verbal cues required          PT Short Term Goals - 11/07/15 1131    PT SHORT TERM GOAL #1   Title Pt will express decreased pain from 5/10 to 2/10 to improve pt's ability to tolerate work load.    Baseline 5/10   Time 3   Period Weeks   Status New   PT SHORT TERM GOAL #2   Title Pt will demonstrate improved upright posture 50% of the time to improve muscle endurance and reduce pain.    Baseline forward head posture, increased kyphosis and lordosis with rounded shoulders.    Time 3   Period Weeks   Status New   PT SHORT TERM GOAL #3   Title Pt will improve AROM cervical rotation in both directions by at least 10*, as well as cervical extension by 10* to promote good posture, and decrease pain.    Baseline Cervical extension 11*, Cervical rotation right 37* with shoulder hiking, and cervical rotation left 38* with shoulder hiking.    Time 3   Period Weeks   Status New   PT SHORT TERM GOAL #4   Title Pt will be compliant with HEP at least 75% of the time to promote strengthening, and decrease pain    Time 3   Period Weeks   Status New           PT Long Term Goals -  11/07/15 1139    PT LONG TERM GOAL #  1   Title Pt will express decreased frequency of tingling down L UE by 50% to promote return to work and driving.    Baseline Pt expresses tingling with upward cervical motion   Time 6   Period Weeks   Status New   PT LONG TERM GOAL #2   Title Pt will express reduction of pain to 0/10 to promote pt's return to normalized workload and activities.    Baseline At evaluation: 5/10   Time 6   Period Weeks   Status New   PT LONG TERM GOAL #3   Title Pt will demonstrate cervical ROM for B rotation, and extension by 20* to normalize AROM and decrease pain.    Baseline At evaluation: Cervical extension: 11*, and cervical rotation right: 37*, and cervical rotation left: 38*   Time 6   Period Weeks   Status New   PT LONG TERM GOAL #4   Title Pt will demonstrate compliance with HEP 80-90% of the time to ensure pt continues to develop increased muscle strengthening, and decrease pain.    Time 6   Period Weeks   Status New               Plan - 11/07/15 1122    Clinical Impression Statement Pt arrived for initial PT evaluation today due to cervical neck pain that radiates down into L shoulder/scapular pain.  Pt demonstrates poor posture awareness, and habits, as well as some scapular dysfuction with shoulder elevation.  Cervical spine strength limited in sidebending in both directions.  No sensation deficits, but pt does c/o tingling down L UE with cervical extension.  Pt would benefit from continued skilled PT to optimize pt's strength, AROM, and posture to reduce pain  and return to normal workload and activities.    Rehab Potential Good   PT Frequency 2x / week   PT Duration 6 weeks   PT Treatment/Interventions Cryotherapy;Electrical Stimulation;Iontophoresis 4mg /ml Dexamethasone;Moist Heat;Traction;Ultrasound;Functional mobility training;Therapeutic activities;Therapeutic exercise;Neuromuscular re-education;Patient/family education;Manual  techniques;Passive range of motion;Dry needling;Energy conservation;Taping   PT Next Visit Plan Review HEP, UE neural tension tests, postural strengthening.   PT Home Exercise Plan Pt given shoulder rolls, scapular retraction, chin tucks, and cervical excursions   Consulted and Agree with Plan of Care Patient      Patient will benefit from skilled therapeutic intervention in order to improve the following deficits and impairments:  Decreased endurance, Decreased activity tolerance, Decreased mobility, Decreased range of motion, Decreased strength, Impaired perceived functional ability, Impaired sensation, Increased fascial restricitons, Hypomobility, Improper body mechanics, Postural dysfunction, Pain  Visit Diagnosis: Cervicalgia - Plan: PT plan of care cert/re-cert  Abnormal posture - Plan: PT plan of care cert/re-cert  Muscle weakness (generalized) - Plan: PT plan of care cert/re-cert     Problem List Patient Active Problem List   Diagnosis Date Noted  . Adenomatous polyps 08/23/2011  . History of colonic polyps 08/23/2011  . DERANGEMENT MENISCUS 03/17/2008  . JOINT EFFUSION, KNEE 03/03/2008  . TEAR A C L 03/03/2008   Beth Dininger, PT, DPT  Deniece Ree E 11/07/2015, 11:56 AM  [facility] [address] [place], [place], [address] Phone: [phone]   Fax:  [phone]  Name: Shane Flores MRN: [id number] Date of Birth: 1961-04-03

## 2015-11-07 NOTE — Patient Instructions (Signed)
  Shoulder Shrug + Rotation  In a sitting position, bring your shoulders vertically up toward your ears in a shrugging motion.  "In a sitting position, bring your shoulders vertically up toward your ears in a shrugging motion. Once at the top of the movement, roll both shoulders backward to open the chest, and repeat going forward to round the upper back. Be sure to maintain your head position directly over your body, with your face level and without jutting forward." 2 x 10 sets, 3 x's/day   Scapular Retraction on Wall  Lean against wall with feet about 1' away from wall. Pull navel towards your spine.  Press elbows into the wall and pinch shoulder blades together, pushing you away from the wall. Hold 3 seconds, then lift one elbow from the wall without letting your hips move. Hold 3 seconds and repeat with the other side.  Rest and repeat. 2 x 10 reps, 3x's/day   RETRACTION / CHIN TUCK  Slowly draw your head back so that your ears line up with your shoulders. 2 x 10 sets, 3x's/day   3D excursions

## 2015-11-08 ENCOUNTER — Ambulatory Visit (HOSPITAL_COMMUNITY): Payer: Commercial Managed Care - PPO

## 2015-11-08 DIAGNOSIS — M542 Cervicalgia: Secondary | ICD-10-CM | POA: Diagnosis not present

## 2015-11-08 DIAGNOSIS — M6281 Muscle weakness (generalized): Secondary | ICD-10-CM

## 2015-11-08 DIAGNOSIS — R293 Abnormal posture: Secondary | ICD-10-CM

## 2015-11-08 NOTE — Therapy (Signed)
[place], [place], [address] Phone: [phone]   Fax:  [phone]  Physical Therapy Treatment  Patient Details  Name: Shane Flores MRN: [id number] Date of Birth: [birth year] Referring Provider: Kary Kos, MD  Encounter Date: 11/08/2015      PT End of Session - 11/08/15 0912    Visit Number 2   Number of Visits 12   Date for PT Re-Evaluation 11/28/15   Authorization Type [employer] UMR   Authorization Time Period 12/19/2015   PT Start Time 0902   PT Stop Time 0941   PT Time Calculation (min) 39 min   Activity Tolerance Patient tolerated treatment well   Behavior During Therapy [facility] for tasks assessed/performed      Past Medical History  Diagnosis Date  . GERD (gastroesophageal reflux disease)   . HTN (hypertension)   . IBS (irritable bowel syndrome)   . Hemorrhoids   . Colon polyps     Past Surgical History  Procedure Laterality Date  . Colonoscopy  07/31/05    anal canal hemorrhoids, tubular adenomas  . Esophagogastroduodenoscopy  07/31/05    normal esophagus, bulbar and proximal D2 erosions, , path: reflux esophagitis  . Knee surgery  2009    left  . Colonoscopy  09/12/2011    Procedure: COLONOSCOPY;  Surgeon: Daneil Dolin, MD;  [facility];  Service: Endoscopy;  Laterality: N/A;  9:00    There were no vitals filed for this visit.      Subjective Assessment - 11/08/15 0901    Subjective Pt stated no real numbness unless he looks up for over a minute.  Reports constant pain base of neck and Bil shoulders pain scale 5/10 today.   Currently in Pain? Yes   Pain Score 5    Pain Location Shoulder   Pain Orientation Left   Pain Descriptors / Indicators Nagging   Pain Type Acute pain   Pain Radiating Towards Lt UE down to hand   Pain Onset More than a month ago   Pain Frequency Constant   Aggravating Factors  looking up   Pain Relieving Factors looking down, and deep blue cream   Effect  of Pain on Daily Activities Has been given lighter work due to requirements for heavy lifting at work            [facility] PT Assessment - 11/08/15 0001    Assessment   Medical Diagnosis cervicalgia   Referring Provider Kary Kos, MD   Onset Date/Surgical Date --  3 months ago   Next MD Visit May 2017 with Dr. Saintclair Halsted   Special Tests    Special Tests Cervical   Cervical Tests other  UE neural tension testing (- for radial, medial and ulnar)   Spurling's   Findings Negative  radicular symptoms resolved with scapular retraction             [facility] Adult PT Treatment/Exercise - 11/08/15 0001    Exercises   Exercises Neck   Neck Exercises: Theraband   Scapula Retraction 10 reps;Red   Shoulder Extension 10 reps;Red   Rows 10 reps;Red   Neck Exercises: Seated   Neck Retraction 10 reps   Neck Retraction Limitations 5" holds   Cervical Rotation 10 reps   Cervical Rotation Limitations 3D cervical excursion with cervical and scapular retraction   W Back 10 reps   Shoulder Rolls Backwards;10 reps   Shoulder Rolls Limitations up, back and relax  Postural Training Importance of proper posture, educated on lumbar towel support with sitting activites with    Other Seated Exercise scapular retraction                PT Education - 11/08/15 1254    Education provided Yes   Education Details Reviewed goals, compliance and assure correct form and technqiue with HEP, educated importance of posture to reduce radicular symptoms. copy of eval given   Person(s) Educated Patient   Methods Explanation;Demonstration;Handout;Tactile cues;Verbal cues   Comprehension Verbalized understanding;Returned demonstration;Need further instruction          PT Short Term Goals - 11/07/15 1131    PT SHORT TERM GOAL #1   Title Pt will express decreased pain from 5/10 to 2/10 to improve pt's ability to tolerate work load.    Baseline 5/10   Time 3   Period Weeks   Status New   PT SHORT TERM  GOAL #2   Title Pt will demonstrate improved upright posture 50% of the time to improve muscle endurance and reduce pain.    Baseline forward head posture, increased kyphosis and lordosis with rounded shoulders.    Time 3   Period Weeks   Status New   PT SHORT TERM GOAL #3   Title Pt will improve AROM cervical rotation in both directions by at least 10*, as well as cervical extension by 10* to promote good posture, and decrease pain.    Baseline Cervical extension 11*, Cervical rotation right 37* with shoulder hiking, and cervical rotation left 38* with shoulder hiking.    Time 3   Period Weeks   Status New   PT SHORT TERM GOAL #4   Title Pt will be compliant with HEP at least 75% of the time to promote strengthening, and decrease pain    Time 3   Period Weeks   Status New           PT Long Term Goals - 11/07/15 1139    PT LONG TERM GOAL #1   Title Pt will express decreased frequency of tingling down L UE by 50% to promote return to work and driving.    Baseline Pt expresses tingling with upward cervical motion   Time 6   Period Weeks   Status New   PT LONG TERM GOAL #2   Title Pt will express reduction of pain to 0/10 to promote pt's return to normalized workload and activities.    Baseline At evaluation: 5/10   Time 6   Period Weeks   Status New   PT LONG TERM GOAL #3   Title Pt will demonstrate cervical ROM for B rotation, and extension by 20* to normalize AROM and decrease pain.    Baseline At evaluation: Cervical extension: 11*, and cervical rotation right: 37*, and cervical rotation left: 38*   Time 6   Period Weeks   Status New   PT LONG TERM GOAL #4   Title Pt will demonstrate compliance with HEP 80-90% of the time to ensure pt continues to develop increased muscle strengthening, and decrease pain.    Time 6   Period Weeks   Status New               Plan - 11/08/15 0949    Clinical Impression Statement Reveiewd goals, compliance and technqiue with HEP  and copy of evaluation given to pt.  Pt educated on importance of posture with verbal, visual and tactile cueing for proper technique with  reports of radicular symptoms down Lt UE resolved.  UE neural tension tests complete with (-) findings for radial, medial and ulnar nerve.  Pt able to demonstrate improved posture wtih min cueing for form with abiltiy to complete seated cervical extension with no reports of radicular symptoms.  Pt educated on techniques to improve posture in seated position with work based activities.  End of session pt reports no radicular symptoms, pain centralized in based of skull with reports of pain reduced to 2/10.  Pt able to demonstrate appropriate form and technqiue with theraband and given red theraband and handout as advanced HEP to begin at home.     Rehab Potential Good   PT Frequency 2x / week   PT Duration 6 weeks   PT Treatment/Interventions Cryotherapy;Electrical Stimulation;Iontophoresis 4mg /ml Dexamethasone;Moist Heat;Traction;Ultrasound;Functional mobility training;Therapeutic activities;Therapeutic exercise;Neuromuscular re-education;Patient/family education;Manual techniques;Passive range of motion;Dry needling;Energy conservation;Taping   PT Home Exercise Plan Reviewed initial HEP, given RTB and handout for posture strengthening.        Patient will benefit from skilled therapeutic intervention in order to improve the following deficits and impairments:  Decreased endurance, Decreased activity tolerance, Decreased mobility, Decreased range of motion, Decreased strength, Impaired perceived functional ability, Impaired sensation, Increased fascial restricitons, Hypomobility, Improper body mechanics, Postural dysfunction, Pain  Visit Diagnosis: Cervicalgia  Abnormal posture  Muscle weakness (generalized)     Problem List Patient Active Problem List   Diagnosis Date Noted  . Adenomatous polyps 08/23/2011  . History of colonic polyps 08/23/2011  .  DERANGEMENT MENISCUS 03/17/2008  . JOINT EFFUSION, KNEE 03/03/2008  . TEAR A C L 03/03/2008   Ihor Austin, LPTA; [place]  Aldona Lento 11/08/2015, 12:59 PM  [place] [address] [place], [place], [address] Phone: [phone]   Fax:  [phone]  Name: Shane Flores MRN: [id number] Date of Birth: [birth year]

## 2015-11-14 ENCOUNTER — Ambulatory Visit (HOSPITAL_COMMUNITY): Payer: Commercial Managed Care - PPO | Attending: Neurosurgery | Admitting: Physical Therapy

## 2015-11-14 DIAGNOSIS — M542 Cervicalgia: Secondary | ICD-10-CM

## 2015-11-14 DIAGNOSIS — R293 Abnormal posture: Secondary | ICD-10-CM | POA: Insufficient documentation

## 2015-11-14 DIAGNOSIS — M6281 Muscle weakness (generalized): Secondary | ICD-10-CM | POA: Diagnosis present

## 2015-11-14 NOTE — Therapy (Signed)
[place], [place], [address] Phone: [phone]   Fax:  [phone]  Physical Therapy Treatment  Patient Details  Name: Shane Flores MRN: [id number] Date of Birth: 1961-05-02 Referring Provider: Kary Kos, MD  Encounter Date: 11/14/2015      PT End of Session - 11/14/15 1749    Visit Number 3   Number of Visits 12   Date for PT Re-Evaluation 11/28/15   Authorization Type [employer] UMR   Authorization Time Period 12/19/2015   PT Start Time 1650   PT Stop Time 1728   PT Time Calculation (min) 38 min   Activity Tolerance Patient tolerated treatment well   Behavior During Therapy [facility] for tasks assessed/performed      Past Medical History  Diagnosis Date  . GERD (gastroesophageal reflux disease)   . HTN (hypertension)   . IBS (irritable bowel syndrome)   . Hemorrhoids   . Colon polyps     Past Surgical History  Procedure Laterality Date  . Colonoscopy  07/31/05    anal canal hemorrhoids, tubular adenomas  . Esophagogastroduodenoscopy  07/31/05    normal esophagus, bulbar and proximal D2 erosions, , path: reflux esophagitis  . Knee surgery  2009    left  . Colonoscopy  09/12/2011    Procedure: COLONOSCOPY;  Surgeon: Daneil Dolin, MD;  [facility];  Service: Endoscopy;  Laterality: N/A;  9:00    There were no vitals filed for this visit.      Subjective Assessment - 11/14/15 1655    Subjective Pt states hes doing better.  States he's been doing his exercises.  4/10 on VAS today.   Currently in Pain? Yes   Pain Score 4    Pain Orientation Mid;Posterior   Pain Descriptors / Indicators Nagging   Pain Radiating Towards Lt UE into hand, stating it goes numb at times                         [facility] Adult PT Treatment/Exercise - 11/14/15 1656    Neck Exercises: Machines for Strengthening   UBE (Upper Arm Bike) 4 minutes backward level 1   Neck Exercises: Theraband   Scapula  Retraction 10 reps;Red   Shoulder Extension 10 reps;Red   Rows 10 reps;Red   Neck Exercises: Standing   Wall Push Ups 10 reps   Upper Extremity Flexion with Stabilization 10 reps   UE Flexion with Stabilization Limitations against wall   Neck Exercises: Seated   Neck Retraction 10 reps   Neck Retraction Limitations 5" holds   Cervical Rotation 10 reps   Shoulder Rolls Backwards;10 reps   Other Seated Exercise scapular retraction   Manual Therapy   Manual Therapy Manual Traction   Manual therapy comments cervical in supine completeted separate from all other exercises.  30" holds, 4 reps with occipital release   Manual Traction 4X30"                   PT Short Term Goals - 11/07/15 1131    PT SHORT TERM GOAL #1   Title Pt will express decreased pain from 5/10 to 2/10 to improve pt's ability to tolerate work load.    Baseline 5/10   Time 3   Period Weeks   Status New   PT SHORT TERM GOAL #2   Title Pt will demonstrate improved upright posture 50% of the time to improve muscle endurance  and reduce pain.    Baseline forward head posture, increased kyphosis and lordosis with rounded shoulders.    Time 3   Period Weeks   Status New   PT SHORT TERM GOAL #3   Title Pt will improve AROM cervical rotation in both directions by at least 10*, as well as cervical extension by 10* to promote good posture, and decrease pain.    Baseline Cervical extension 11*, Cervical rotation right 37* with shoulder hiking, and cervical rotation left 38* with shoulder hiking.    Time 3   Period Weeks   Status New   PT SHORT TERM GOAL #4   Title Pt will be compliant with HEP at least 75% of the time to promote strengthening, and decrease pain    Time 3   Period Weeks   Status New           PT Long Term Goals - 11/07/15 1139    PT LONG TERM GOAL #1   Title Pt will express decreased frequency of tingling down L UE by 50% to promote return to work and driving.    Baseline Pt expresses  tingling with upward cervical motion   Time 6   Period Weeks   Status New   PT LONG TERM GOAL #2   Title Pt will express reduction of pain to 0/10 to promote pt's return to normalized workload and activities.    Baseline At evaluation: 5/10   Time 6   Period Weeks   Status New   PT LONG TERM GOAL #3   Title Pt will demonstrate cervical ROM for B rotation, and extension by 20* to normalize AROM and decrease pain.    Baseline At evaluation: Cervical extension: 11*, and cervical rotation right: 37*, and cervical rotation left: 38*   Time 6   Period Weeks   Status New   PT LONG TERM GOAL #4   Title Pt will demonstrate compliance with HEP 80-90% of the time to ensure pt continues to develop increased muscle strengthening, and decrease pain.    Time 6   Period Weeks   Status New               Plan - 11/14/15 1750    Clinical Impression Statement Reviewed theraband exercises with patient needing 50% recall with therex and form.  Added wall push ups and UE flexion in good posture.  Pt with increase symptoms down Lt LE when in good posture, no symptoms with forward flexed head.  Completed manual traction to trial affectiveness of relieving neural symptoms with good results at end of session.  Able to keep upright posturing without increase Lt UE symptoms.     Rehab Potential Good   PT Frequency 2x / week   PT Duration 6 weeks   PT Treatment/Interventions Cryotherapy;Electrical Stimulation;Iontophoresis 4mg /ml Dexamethasone;Moist Heat;Traction;Ultrasound;Functional mobility training;Therapeutic activities;Therapeutic exercise;Neuromuscular re-education;Patient/family education;Manual techniques;Passive range of motion;Dry needling;Energy conservation;Taping   PT Next Visit Plan Continue to progress towards goals with focus on improving postural strength.  Assess effectiveness of manual traction and continue if beneficial.       Patient will benefit from skilled therapeutic intervention  in order to improve the following deficits and impairments:  Decreased endurance, Decreased activity tolerance, Decreased mobility, Decreased range of motion, Decreased strength, Impaired perceived functional ability, Impaired sensation, Increased fascial restricitons, Hypomobility, Improper body mechanics, Postural dysfunction, Pain, Prosthetic Dependency  Visit Diagnosis: Cervicalgia  Abnormal posture  Muscle weakness (generalized)     Problem List Patient Active  Problem List   Diagnosis Date Noted  . Adenomatous polyps 08/23/2011  . History of colonic polyps 08/23/2011  . DERANGEMENT MENISCUS 03/17/2008  . JOINT EFFUSION, KNEE 03/03/2008  . TEAR A Mikle Bosworth 03/03/2008    Teena Irani, PTA/CLT [phone]  11/14/2015, 6:02 PM  [place] [address] [place], [place], [address] Phone: [phone]   Fax:  [phone]  Name: Shane Flores MRN: [id number] Date of Birth: August 16, 1960

## 2015-11-15 ENCOUNTER — Encounter (HOSPITAL_COMMUNITY): Payer: Commercial Managed Care - PPO | Admitting: Physical Therapy

## 2015-11-17 ENCOUNTER — Encounter (HOSPITAL_COMMUNITY): Payer: Commercial Managed Care - PPO

## 2015-11-22 ENCOUNTER — Ambulatory Visit (HOSPITAL_COMMUNITY): Payer: Commercial Managed Care - PPO

## 2015-11-22 DIAGNOSIS — M542 Cervicalgia: Secondary | ICD-10-CM

## 2015-11-22 DIAGNOSIS — R293 Abnormal posture: Secondary | ICD-10-CM

## 2015-11-22 DIAGNOSIS — M6281 Muscle weakness (generalized): Secondary | ICD-10-CM

## 2015-11-22 NOTE — Therapy (Signed)
[place], [place], [address] Phone: [phone]   Fax:  [phone]  Physical Therapy Treatment  Patient Details  Name: Shane Flores MRN: [id number] Date of Birth: [birth year] Referring Provider: Kary Kos, MD  Encounter Date: 11/22/2015      PT End of Session - 11/22/15 0825    Visit Number 4   Number of Visits 12   Date for PT Re-Evaluation 11/28/15   Authorization Type UHC UMR   Authorization Time Period 12/19/2015   PT Start Time 0812   PT Stop Time 0855   PT Time Calculation (min) 43 min   Activity Tolerance Patient tolerated treatment well   Behavior During Therapy [facility] for tasks assessed/performed      Past Medical History  Diagnosis Date  . GERD (gastroesophageal reflux disease)   . HTN (hypertension)   . IBS (irritable bowel syndrome)   . Hemorrhoids   . Colon polyps     Past Surgical History  Procedure Laterality Date  . Colonoscopy  07/31/05    anal canal hemorrhoids, tubular adenomas  . Esophagogastroduodenoscopy  07/31/05    normal esophagus, bulbar and proximal D2 erosions, , path: reflux esophagitis  . Knee surgery  2009    left  . Colonoscopy  09/12/2011    Procedure: COLONOSCOPY;  Surgeon: Daneil Dolin, MD;  [facility];  Service: Endoscopy;  Laterality: N/A;  9:00    There were no vitals filed for this visit.      Subjective Assessment - 11/22/15 0811    Subjective Pt states he is doing well today, continues to have increased pain with activitiew with work.  Pain scale 3/10 in center of neck and top of shoulder blades   Currently in Pain? Yes   Pain Score 3    Pain Location Neck   Pain Orientation Mid;Posterior   Pain Descriptors / Indicators Dull;Nagging   Pain Type Acute pain   Pain Radiating Towards Lt UE into hand, stating it goes numb at times   Pain Onset More than a month ago   Pain Frequency Constant   Aggravating Factors  looking up   Pain Relieving  Factors looking down, and deep blue cream   Effect of Pain on Daily Activities Has been given lighter work due to requirements for heavy lifting at work              [employer] Adult PT Treatment/Exercise - 11/22/15 0001    Neck Exercises: Machines for Strengthening   UBE (Upper Arm Bike) 4 minutes backward level 1   Neck Exercises: Theraband   Scapula Retraction 10 reps;Red   Shoulder Extension 10 reps;Red   Rows 10 reps;Red   Neck Exercises: Standing   Wall Push Ups 10 reps   Upper Extremity Flexion with Stabilization 10 reps   UE Flexion with Stabilization Limitations against wall   Neck Exercises: Prone   Shoulder Extension 10 reps   Rows Limitations 10   Manual Therapy   Manual Therapy Manual Traction;Soft tissue mobilization   Manual therapy comments cervical in supine completeted separate from all other exercises.  30" holds, 4 reps with occipital release   Soft tissue mobilization Posterior cervical musculature   Manual Traction 4X30"            PT Short Term Goals - 11/07/15 1131    PT SHORT TERM GOAL #1   Title Pt will express decreased pain from 5/10 to 2/10 to improve  pt's ability to tolerate work load.    Baseline 5/10   Time 3   Period Weeks   Status New   PT SHORT TERM GOAL #2   Title Pt will demonstrate improved upright posture 50% of the time to improve muscle endurance and reduce pain.    Baseline forward head posture, increased kyphosis and lordosis with rounded shoulders.    Time 3   Period Weeks   Status New   PT SHORT TERM GOAL #3   Title Pt will improve AROM cervical rotation in both directions by at least 10*, as well as cervical extension by 10* to promote good posture, and decrease pain.    Baseline Cervical extension 11*, Cervical rotation right 37* with shoulder hiking, and cervical rotation left 38* with shoulder hiking.    Time 3   Period Weeks   Status New   PT SHORT TERM GOAL #4   Title Pt will be compliant with HEP at least 75% of the  time to promote strengthening, and decrease pain    Time 3   Period Weeks   Status New           PT Long Term Goals - 11/07/15 1139    PT LONG TERM GOAL #1   Title Pt will express decreased frequency of tingling down L UE by 50% to promote return to work and driving.    Baseline Pt expresses tingling with upward cervical motion   Time 6   Period Weeks   Status New   PT LONG TERM GOAL #2   Title Pt will express reduction of pain to 0/10 to promote pt's return to normalized workload and activities.    Baseline At evaluation: 5/10   Time 6   Period Weeks   Status New   PT LONG TERM GOAL #3   Title Pt will demonstrate cervical ROM for B rotation, and extension by 20* to normalize AROM and decrease pain.    Baseline At evaluation: Cervical extension: 11*, and cervical rotation right: 37*, and cervical rotation left: 38*   Time 6   Period Weeks   Status New   PT LONG TERM GOAL #4   Title Pt will demonstrate compliance with HEP 80-90% of the time to ensure pt continues to develop increased muscle strengthening, and decrease pain.    Time 6   Period Weeks   Status New               Plan - 11/22/15 0834    Clinical Impression Statement Session focus on improving postural strengthening with moderate therapist facilitation for proper form and technqiue with majority of exercuses.  Pt able to complete all exercises with no reports of increased pain, did report Lt palm up to elbow numbness during wall pushes, pt flexed neck and reports numbness resolved.  Added prone postural strenghteing exercises wtih no reports of increased symptoms.  Ended session with manual soft tissue mobilizaiton and cervical traction with positive results.  End of session pt reports pain free with no radicular symptoms.  Pt stated he went almost 2 days without symptoms following manual last session.     Rehab Potential Good   PT Frequency 2x / week   PT Duration 6 weeks   PT Treatment/Interventions  Cryotherapy;Electrical Stimulation;Iontophoresis 4mg /ml Dexamethasone;Moist Heat;Traction;Ultrasound;Functional mobility training;Therapeutic activities;Therapeutic exercise;Neuromuscular re-education;Patient/family education;Manual techniques;Passive range of motion;Dry needling;Energy conservation;Taping   PT Next Visit Plan Continue to progress towards goals with focus on improving postural strength.  Assess effectiveness of  manual traction and continue if beneficial.       Patient will benefit from skilled therapeutic intervention in order to improve the following deficits and impairments:  Decreased endurance, Decreased activity tolerance, Decreased mobility, Decreased range of motion, Decreased strength, Impaired perceived functional ability, Impaired sensation, Increased fascial restricitons, Hypomobility, Improper body mechanics, Postural dysfunction, Pain, Prosthetic Dependency  Visit Diagnosis: Cervicalgia  Abnormal posture  Muscle weakness (generalized)     Problem List Patient Active Problem List   Diagnosis Date Noted  . Adenomatous polyps 08/23/2011  . History of colonic polyps 08/23/2011  . DERANGEMENT MENISCUS 03/17/2008  . JOINT EFFUSION, KNEE 03/03/2008  . TEAR A C L 03/03/2008   Ihor Austin, LPTA; [place]  Aldona Lento 11/22/2015, 9:11 AM  [place], [place], [address] Phone: [phone]   Fax:  [phone]  Name: Shane Flores MRN: [id number] Date of Birth: [birth year]

## 2015-11-24 ENCOUNTER — Ambulatory Visit (HOSPITAL_COMMUNITY): Payer: Commercial Managed Care - PPO

## 2015-11-24 DIAGNOSIS — M542 Cervicalgia: Secondary | ICD-10-CM

## 2015-11-24 DIAGNOSIS — R293 Abnormal posture: Secondary | ICD-10-CM

## 2015-11-24 DIAGNOSIS — M6281 Muscle weakness (generalized): Secondary | ICD-10-CM

## 2015-11-24 NOTE — Therapy (Signed)
[place], [place], [address] Phone: [phone]   Fax:  [phone]  Physical Therapy Treatment  Patient Details  Name: Shane Flores MRN: [id number] Date of Birth: 12/01/1960 Referring Provider: Kary Kos, MD  Encounter Date: 11/24/2015      PT End of Session - 11/24/15 0830    Visit Number 5   Number of Visits 12   Date for PT Re-Evaluation 11/28/15   Authorization Type [employer] UMR   Authorization Time Period 12/19/2015   PT Start Time 0815   PT Stop Time 0853   PT Time Calculation (min) 38 min   Activity Tolerance Patient tolerated treatment well;No increased pain   Behavior During Therapy  [facility] for tasks assessed/performed      Past Medical History  Diagnosis Date  . GERD (gastroesophageal reflux disease)   . HTN (hypertension)   . IBS (irritable bowel syndrome)   . Hemorrhoids   . Colon polyps     Past Surgical History  Procedure Laterality Date  . Colonoscopy  07/31/05    anal canal hemorrhoids, tubular adenomas  . Esophagogastroduodenoscopy  07/31/05    normal esophagus, bulbar and proximal D2 erosions, , path: reflux esophagitis  . Knee surgery  2009    left  . Colonoscopy  09/12/2011    Procedure: COLONOSCOPY;  Surgeon: Daneil Dolin, MD;  [facility];  Service: Endoscopy;  Laterality: N/A;  9:00    There were no vitals filed for this visit.      Subjective Assessment - 11/24/15 0820    Subjective Pt reports HEP is going well, makes him mildly sore thereafter, but otherwise ok. Still feels in LUE numbness (elbow to hand, volar and dorsal) with cervical extention.    Pertinent History Has had right shoulder bursitis/tendonitis in the past   Limitations Other (comment)   Currently in Pain? Yes   Pain Score 3    Pain Location Neck  central neck C6/C7   Pain Type Chronic pain                         [facility] Adult PT Treatment/Exercise - 11/24/15 0001    Neck  Exercises: Theraband   Scapula Retraction 10 reps;Red  2x10   Shoulder Extension 10 reps;Red  2x10   Rows 10 reps;Red  2x10   Neck Exercises: Standing   Wall Push Ups 10 reps   Neck Exercises: Seated   Neck Retraction 10 reps   Neck Retraction Limitations 5" holds   Cervical Rotation 10 reps   W Back 10 reps   Manual Therapy   Manual Therapy Muscle Energy Technique   Manual Traction 2x45s, + occipital release   Muscle Energy Technique OA rotational stretch 5x bilat  5s on, 20s off                  PT Short Term Goals - 11/07/15 1131    PT SHORT TERM GOAL #1   Title Pt will express decreased pain from 5/10 to 2/10 to improve pt's ability to tolerate work load.    Baseline 5/10   Time 3   Period Weeks   Status New   PT SHORT TERM GOAL #2   Title Pt will demonstrate improved upright posture 50% of the time to improve muscle endurance and reduce pain.    Baseline forward head posture, increased kyphosis and lordosis with rounded shoulders.    Time 3  Period Weeks   Status New   PT SHORT TERM GOAL #3   Title Pt will improve AROM cervical rotation in both directions by at least 10*, as well as cervical extension by 10* to promote good posture, and decrease pain.    Baseline Cervical extension 11*, Cervical rotation right 37* with shoulder hiking, and cervical rotation left 38* with shoulder hiking.    Time 3   Period Weeks   Status New   PT SHORT TERM GOAL #4   Title Pt will be compliant with HEP at least 75% of the time to promote strengthening, and decrease pain    Time 3   Period Weeks   Status New           PT Long Term Goals - 11/07/15 1139    PT LONG TERM GOAL #1   Title Pt will express decreased frequency of tingling down L UE by 50% to promote return to work and driving.    Baseline Pt expresses tingling with upward cervical motion   Time 6   Period Weeks   Status New   PT LONG TERM GOAL #2   Title Pt will express reduction of pain to 0/10 to  promote pt's return to normalized workload and activities.    Baseline At evaluation: 5/10   Time 6   Period Weeks   Status New   PT LONG TERM GOAL #3   Title Pt will demonstrate cervical ROM for B rotation, and extension by 20* to normalize AROM and decrease pain.    Baseline At evaluation: Cervical extension: 11*, and cervical rotation right: 37*, and cervical rotation left: 38*   Time 6   Period Weeks   Status New   PT LONG TERM GOAL #4   Title Pt will demonstrate compliance with HEP 80-90% of the time to ensure pt continues to develop increased muscle strengthening, and decrease pain.    Time 6   Period Weeks   Status New               Plan - 11/24/15 [id number]    Clinical Impression Statement Continued with periscapular strengthening with avoiding LUE numbness. Progressed number of sets on bands today, tolerating well. Making ggood progress toward goals. Left side scapular retractors/depressors are quite limited. Pt continues to c/o LUeEnumbness with Left cervical rotation (~35-35 degrees) and  cervical extention. Upper cervical rotation (OA) is very limited, R>L), but after stretching patients ROM in seated rot and extention is double before onset of LUE numbness.    Rehab Potential Good   PT Frequency 2x / week   PT Duration 6 weeks   PT Treatment/Interventions Cryotherapy;Electrical Stimulation;Iontophoresis 4mg /ml Dexamethasone;Moist Heat;Traction;Ultrasound;Functional mobility training;Therapeutic activities;Therapeutic exercise;Neuromuscular re-education;Patient/family education;Manual techniques;Passive range of motion;Dry needling;Energy conservation;Taping   PT Next Visit Plan Increase bands to green color next session.    PT Home Exercise Plan Added Retraction into 2 pillows 1x10x5sec BID, and OA rotational stretch seated. Needs printouts and review next session.    Consulted and Agree with Plan of Care Patient      Patient will benefit from skilled therapeutic  intervention in order to improve the following deficits and impairments:  Decreased endurance, Decreased activity tolerance, Decreased mobility, Decreased range of motion, Decreased strength, Impaired perceived functional ability, Impaired sensation, Increased fascial restricitons, Hypomobility, Improper body mechanics, Postural dysfunction, Pain, Prosthetic Dependency  Visit Diagnosis: Cervicalgia  Abnormal posture  Muscle weakness (generalized)     Problem List Patient Active Problem List   Diagnosis  Date Noted  . Adenomatous polyps 08/23/2011  . History of colonic polyps 08/23/2011  . DERANGEMENT MENISCUS 03/17/2008  . JOINT EFFUSION, KNEE 03/03/2008  . TEAR A C L 03/03/2008    9:00 AM, 11/24/2015 Etta Grandchild, PT, DPT PRN Physical Therapist - [place] License # [id number] (mobile)    [facility] [address] [place], [place], [address] Phone: [phone]   Fax:  [phone]  Name: ORDEAN MURLEY MRN: [id number] Date of Birth: April 14, 1961

## 2015-11-29 ENCOUNTER — Ambulatory Visit (HOSPITAL_COMMUNITY): Payer: Commercial Managed Care - PPO | Admitting: Physical Therapy

## 2015-11-29 DIAGNOSIS — M542 Cervicalgia: Secondary | ICD-10-CM | POA: Diagnosis not present

## 2015-11-29 DIAGNOSIS — M6281 Muscle weakness (generalized): Secondary | ICD-10-CM

## 2015-11-29 DIAGNOSIS — R293 Abnormal posture: Secondary | ICD-10-CM

## 2015-11-29 NOTE — Therapy (Signed)
[facility] [address] [place], [place], [address] Phone: [phone]   Fax:  [phone]  Physical Therapy Treatment (Re-Assessment)  Patient Details  Name: Shane Flores MRN: [id number] Date of Birth: March 29, 1961 Referring Provider: Kary Kos, MD  Encounter Date: 11/29/2015      PT End of Session - 11/29/15 0908    Visit Number 6   Number of Visits 12   Date for PT Re-Evaluation 12/20/15   Authorization Type UHC UMR   Authorization Time Period 11/07/15 to 12/19/15   PT Start Time 0815   PT Stop Time 0855   PT Time Calculation (min) 40 min   Activity Tolerance Patient tolerated treatment well   Behavior During Therapy [facility] for tasks assessed/performed      Past Medical History  Diagnosis Date  . GERD (gastroesophageal reflux disease)   . HTN (hypertension)   . IBS (irritable bowel syndrome)   . Hemorrhoids   . Colon polyps     Past Surgical History  Procedure Laterality Date  . Colonoscopy  07/31/05    anal canal hemorrhoids, tubular adenomas  . Esophagogastroduodenoscopy  07/31/05    normal esophagus, bulbar and proximal D2 erosions, , path: reflux esophagitis  . Knee surgery  2009    left  . Colonoscopy  09/12/2011    Procedure: COLONOSCOPY;  Surgeon: Daneil Dolin, MD;  [facility];  Service: Endoscopy;  Laterality: N/A;  9:00    There were no vitals filed for this visit.      Subjective Assessment - 11/29/15 0815    Subjective Patient reports that things are going well but he does not feel like he is getting any better as he is continuing to have UE numbness, which appears to mostly be staying about the same but at itmes can seem worse with whole hand tingling; pain can get up to 5-6/10, and it is at its best when he is sitting or lying down at 0-2/10. He sleeps fine at night. No falls or close calls recently.    Pertinent History Has had right shoulder bursitis/tendonitis in the past   Currently in Pain?  Yes   Pain Score 3    Pain Location Neck   Pain Orientation Mid;Posterior   Pain Descriptors / Indicators Dull;Nagging   Pain Type Chronic pain   Pain Radiating Towards down into L UE and hand    Pain Onset More than a month ago   Pain Frequency Constant   Aggravating Factors  lifting items    Pain Relieving Factors sitting in recliner or laying down   Effect of Pain on Daily Activities isn't stopping him from doing anything, just pushes through             [facility] PT Assessment - 11/29/15 0001    Observation/Other Assessments   Focus on Therapeutic Outcomes (FOTO)  33% limited (was 46% limited)   Sensation   Additional Comments WFL for light touch, impaired to sharp touch especially C7   AROM   Right Shoulder Internal Rotation --  T10   Right Shoulder External Rotation --  T2   Left Shoulder Internal Rotation --  T10   Left Shoulder External Rotation --  T2   Cervical Flexion 60   Cervical Extension 24   Cervical - Right Side Bend 38  shoulder hiking    Cervical - Left Side Bend 37  shoulder hiking    Cervical - Right Rotation 80   Cervical -  Left Rotation 65   Strength   Cervical Flexion 5/5   Cervical Extension 5/5   Cervical - Right Side Bend 5/5   Cervical - Left Side Bend 5/5   Palpation   Palpation comment muscle tension noted but no significant trigger poitns on L (did note one on R)                      [facility] Adult PT Treatment/Exercise - 11/29/15 0001    Neck Exercises: Standing   Other Standing Exercises scapular retracttions, shoulder extensions, rows with green TB 1x10   Neck Exercises: Seated   Other Seated Exercise thoracic excursions 1x15   Neck Exercises: Prone   Other Prone Exercise prone I, Y, and Ws 1x10   Manual Therapy   Manual Therapy Joint mobilization   Manual therapy comments performed separately from all otehr skilled interventions    Joint Mobilization first rib mobs 1x30 seconds on L                 PT  Education - 11/29/15 0908    Education provided Yes   Education Details progress with skilled PT services, plan of care moving forward    Person(s) Educated Patient   Methods Explanation   Comprehension Verbalized understanding          PT Short Term Goals - 11/29/15 0833    PT SHORT TERM GOAL #1   Title Pt will express decreased pain from 5/10 to 2/10 to improve pt's ability to tolerate work load.    Baseline 5/16- patient reports that he has days where he has no pain, and some where he does    Time 3   Period Weeks   Status Partially Met   PT SHORT TERM GOAL #2   Title Pt will demonstrate improved upright posture 50% of the time to improve muscle endurance and reduce pain.    Baseline 5/16- has been working on this, doing OK with it    Time 3   Period Weeks   Status Achieved   PT SHORT TERM GOAL #3   Title Pt will improve AROM cervical rotation in both directions by at least 10*, as well as cervical extension by 10* to promote good posture, and decrease pain.    Baseline 5/16- partially met, extension much improved    Time 3   Period Weeks   Status Partially Met   PT SHORT TERM GOAL #4   Title Pt will be compliant with HEP at least 75% of the time to promote strengthening, and decrease pain    Baseline 5/16- reports doing HEP every day    Time 3   Period Weeks   Status Achieved           PT Long Term Goals - 11/29/15 9937    PT LONG TERM GOAL #1   Title Pt will express decreased frequency of tingling down L UE by 50% to promote return to work and driving.    Baseline 5/16- is not decreasing    Time 6   Period Weeks   Status On-going   PT LONG TERM GOAL #2   Title Pt will express reduction of pain to 0/10 to promote pt's return to normalized workload and activities.    Baseline 5/16- sometimes can get to 0/10, sometimes it stays high    Time 6   Period Weeks   Status Partially Met   PT LONG TERM GOAL #3   Title  Pt will demonstrate cervical ROM for B rotation,  and extension by 20* to normalize AROM and decrease pain.    Time 6   Period Weeks   Status Partially Met   PT LONG TERM GOAL #4   Title Pt will demonstrate compliance with HEP 80-90% of the time to ensure pt continues to develop increased muscle strengthening, and decrease pain.    Baseline 5/16- doing every day    Time 6   Period Weeks   Status Achieved               Plan - 11/29/15 0841    Clinical Impression Statement Re-assessment performed today. Patient does show some improvement in general cervical ROM and posture, however does continue to experience fluctuating levels of pain and pretty much a consistent level of numbness in his L UE espeically. Did note intact light touch sensation however somewhat impaired sensation to sharps testing today especially in C7 dermatome however C8 dermatomes may also be involved in impaired sharp sensation. Patient does feel that he is improving and is interested in continuing with skilled PT services at this point. Recommend ongoing skilled PT services in order to address functional limtations and assist in reaching optimal level of function.    Rehab Potential Good   PT Frequency 2x / week   PT Duration 3 weeks   PT Treatment/Interventions Cryotherapy;Electrical Stimulation;Iontophoresis 62m/ml Dexamethasone;Moist Heat;Traction;Ultrasound;Functional mobility training;Therapeutic activities;Therapeutic exercise;Neuromuscular re-education;Patient/family education;Manual techniques;Passive range of motion;Dry needling;Energy conservation;Taping   PT Next Visit Plan postural training and functional posture exercise with green TB; manual traction if well tolerated   Consulted and Agree with Plan of Care Patient      Patient will benefit from skilled therapeutic intervention in order to improve the following deficits and impairments:  Decreased endurance, Decreased activity tolerance, Decreased mobility, Decreased range of motion, Decreased  strength, Impaired perceived functional ability, Impaired sensation, Increased fascial restricitons, Hypomobility, Improper body mechanics, Postural dysfunction, Pain, Prosthetic Dependency  Visit Diagnosis: Cervicalgia  Abnormal posture  Muscle weakness (generalized)     Problem List Patient Active Problem List   Diagnosis Date Noted  . Adenomatous polyps 08/23/2011  . History of colonic polyps 08/23/2011  . DERANGEMENT MENISCUS 03/17/2008  . JOINT EFFUSION, KNEE 03/03/2008  . TEAR A C L 03/03/2008    KDeniece ReePT, DPT [id number] StreetSFithian [place] [address]Phone: [phone]  Fax:  [phone] Name: Shane DECELLESMRN: [id number] of Birth: 206-Jul-1962

## 2015-12-01 ENCOUNTER — Ambulatory Visit (HOSPITAL_COMMUNITY): Payer: Commercial Managed Care - PPO

## 2015-12-01 DIAGNOSIS — M542 Cervicalgia: Secondary | ICD-10-CM

## 2015-12-01 DIAGNOSIS — M6281 Muscle weakness (generalized): Secondary | ICD-10-CM

## 2015-12-01 DIAGNOSIS — R293 Abnormal posture: Secondary | ICD-10-CM

## 2015-12-01 NOTE — Therapy (Signed)
[place], [place], [address] Phone: [phone]   Fax:  [phone]  Physical Therapy Treatment  Patient Details  Name: Shane Flores MRN: [id number] Date of Birth: 21-Apr-1961 Referring Provider: Kary Kos, MD  Encounter Date: 12/01/2015      PT End of Session - 12/01/15 0818    Visit Number 7   Number of Visits 12   Date for PT Re-Evaluation 12/20/15   Authorization Type UHC UMR   Authorization Time Period 11/07/15 to 12/19/15   PT Start Time 0812   PT Stop Time 0857   PT Time Calculation (min) 45 min   Activity Tolerance Patient tolerated treatment well   Behavior During Therapy [facility] for tasks assessed/performed      Past Medical History  Diagnosis Date  . GERD (gastroesophageal reflux disease)   . HTN (hypertension)   . IBS (irritable bowel syndrome)   . Hemorrhoids   . Colon polyps     Past Surgical History  Procedure Laterality Date  . Colonoscopy  07/31/05    anal canal hemorrhoids, tubular adenomas  . Esophagogastroduodenoscopy  07/31/05    normal esophagus, bulbar and proximal D2 erosions, , path: reflux esophagitis  . Knee surgery  2009    left  . Colonoscopy  09/12/2011    Procedure: COLONOSCOPY;  Surgeon: Daneil Dolin, MD;  [facility];  Service: Endoscopy;  Laterality: N/A;  9:00    There were no vitals filed for this visit.      Subjective Assessment - 12/01/15 0805    Subjective Pt stated he has nagging pain posterior neck pain scale 3/10 and tingling numbness from Lt elbow down to hand   Pertinent History Has had right shoulder bursitis/tendonitis in the past   Currently in Pain? Yes   Pain Score 3    Pain Location Neck   Pain Orientation Posterior   Pain Descriptors / Indicators Nagging   Pain Type Chronic pain   Pain Radiating Towards down into L UE and hand   Pain Onset More than a month ago   Pain Frequency Intermittent   Aggravating Factors  lifting items    Pain Relieving Factors sitting in recliner or laying down   Effect of Pain on Daily Activities isn't stopping him from doing anything, just pushes through              [facility] Adult PT Treatment/Exercise - 12/01/15 0001    Neck Exercises: Machines for Strengthening   UBE (Upper Arm Bike) 4 minutes backward level 1   Neck Exercises: Theraband   Scapula Retraction 15 reps;Green   Shoulder Extension 15 reps;Green   Rows 15 reps;Green   Neck Exercises: Standing   Wall Push Ups 10 reps   Upper Extremity Flexion with Stabilization 10 reps   UE Flexion with Stabilization Limitations against wall   Neck Exercises: Seated   Neck Retraction 10 reps   Neck Retraction Limitations 5" holds   Shoulder Rolls Backwards;10 reps   Shoulder Rolls Limitations up, back and relax   Neck Exercises: Prone   Shoulder Extension 10 reps  2 sets 1) IR 2) ER   Rows Limitations 15   Other Prone Exercise Prone T and Ws   Manual Therapy   Manual Therapy Soft tissue mobilization;Manual Traction   Manual therapy comments performed separately from all otehr skilled interventions    Soft tissue mobilization Posterior cervical musculature   Manual Traction 2x45s, + occipital release  PT Short Term Goals - 11/29/15 0076    PT SHORT TERM GOAL #1   Title Pt will express decreased pain from 5/10 to 2/10 to improve pt's ability to tolerate work load.    Baseline 5/16- patient reports that he has days where he has no pain, and some where he does    Time 3   Period Weeks   Status Partially Met   PT SHORT TERM GOAL #2   Title Pt will demonstrate improved upright posture 50% of the time to improve muscle endurance and reduce pain.    Baseline 5/16- has been working on this, doing OK with it    Time 3   Period Weeks   Status Achieved   PT SHORT TERM GOAL #3   Title Pt will improve AROM cervical rotation in both directions by at least 10*, as well as cervical extension by 10* to promote good  posture, and decrease pain.    Baseline 5/16- partially met, extension much improved    Time 3   Period Weeks   Status Partially Met   PT SHORT TERM GOAL #4   Title Pt will be compliant with HEP at least 75% of the time to promote strengthening, and decrease pain    Baseline 5/16- reports doing HEP every day    Time 3   Period Weeks   Status Achieved           PT Long Term Goals - 11/29/15 2263    PT LONG TERM GOAL #1   Title Pt will express decreased frequency of tingling down L UE by 50% to promote return to work and driving.    Baseline 5/16- is not decreasing    Time 6   Period Weeks   Status On-going   PT LONG TERM GOAL #2   Title Pt will express reduction of pain to 0/10 to promote pt's return to normalized workload and activities.    Baseline 5/16- sometimes can get to 0/10, sometimes it stays high    Time 6   Period Weeks   Status Partially Met   PT LONG TERM GOAL #3   Title Pt will demonstrate cervical ROM for B rotation, and extension by 20* to normalize AROM and decrease pain.    Time 6   Period Weeks   Status Partially Met   PT LONG TERM GOAL #4   Title Pt will demonstrate compliance with HEP 80-90% of the time to ensure pt continues to develop increased muscle strengthening, and decrease pain.    Baseline 5/16- doing every day    Time 6   Period Weeks   Status Achieved               Plan - 12/01/15 0835    Clinical Impression Statement Pt presents with forward head and forward internal rotated shoulders.  Session focus on education on benefits with increased awareness of posture and progressed posture strengthening.  Pt tolerated well with increased resistance with green theraband and able to increase reps with exercises with no reports of increased pain.  Added prone ER with shoulder extension with positive results.  Therapist facilitation throuigh session to improve posture and min cueing for form with exercises.  Pt reports decreased radicular  symptoms with scapular retraction.  End of session pt reports pain free with no reports of radicular symptoms.     Rehab Potential Good   PT Frequency 2x / week   PT Duration 3 weeks   PT  Treatment/Interventions Cryotherapy;Electrical Stimulation;Iontophoresis 10m/ml Dexamethasone;Moist Heat;Traction;Ultrasound;Functional mobility training;Therapeutic activities;Therapeutic exercise;Neuromuscular re-education;Patient/family education;Manual techniques;Passive range of motion;Dry needling;Energy conservation;Taping   PT Next Visit Plan postural training and functional posture exercise with green TB; manual traction if well tolerated.  Add ER with theraband next session.      Patient will benefit from skilled therapeutic intervention in order to improve the following deficits and impairments:  Decreased endurance, Decreased activity tolerance, Decreased mobility, Decreased range of motion, Decreased strength, Impaired perceived functional ability, Impaired sensation, Increased fascial restricitons, Hypomobility, Improper body mechanics, Postural dysfunction, Pain, Prosthetic Dependency  Visit Diagnosis: Cervicalgia  Abnormal posture  Muscle weakness (generalized)     Problem List Patient Active Problem List   Diagnosis Date Noted  . Adenomatous polyps 08/23/2011  . History of colonic polyps 08/23/2011  . DERANGEMENT MENISCUS 03/17/2008  . JOINT EFFUSION, KNEE 03/03/2008  . TEAR A C L 03/03/2008   CIhor Austin LPTA; CWylie CAldona Lento5/18/2017, 9:27 AM  CJennings78[address]SPocono [place] [address]Phone: [phone]  Fax:  [phone] Name: Shane BERISHAMRN: [id number] of Birth: [birth year]

## 2015-12-06 ENCOUNTER — Telehealth (HOSPITAL_COMMUNITY): Payer: Self-pay

## 2015-12-06 ENCOUNTER — Encounter (HOSPITAL_COMMUNITY): Payer: Commercial Managed Care - PPO

## 2015-12-06 NOTE — Telephone Encounter (Signed)
12/06/15 left message that he wouldn't be able to come in today

## 2015-12-08 ENCOUNTER — Ambulatory Visit (HOSPITAL_COMMUNITY): Payer: Commercial Managed Care - PPO | Admitting: Physical Therapy

## 2015-12-08 DIAGNOSIS — M542 Cervicalgia: Secondary | ICD-10-CM | POA: Diagnosis not present

## 2015-12-08 DIAGNOSIS — M6281 Muscle weakness (generalized): Secondary | ICD-10-CM

## 2015-12-08 DIAGNOSIS — R293 Abnormal posture: Secondary | ICD-10-CM

## 2015-12-08 NOTE — Therapy (Signed)
[place], [place], [address] Phone: [phone]   Fax:  [phone]  Physical Therapy Treatment  Patient Details  Name: Shane Flores MRN: [id number] Date of Birth: 04/20/1961 Referring Provider: Kary Kos, MD  Encounter Date: 12/08/2015      PT End of Session - 12/08/15 0856    Visit Number 8   Number of Visits 12   Date for PT Re-Evaluation 12/20/15   Authorization Type [employer] UMR   Authorization Time Period 11/07/15 to 12/19/15   PT Start Time 0815   PT Stop Time 0854  5 min unsupervised exercise on UE bike at end of session    PT Time Calculation (min) 39 min   Activity Tolerance Patient tolerated treatment well   Behavior During Therapy [facility] for tasks assessed/performed      Past Medical History  Diagnosis Date  . GERD (gastroesophageal reflux disease)   . HTN (hypertension)   . IBS (irritable bowel syndrome)   . Hemorrhoids   . Colon polyps     Past Surgical History  Procedure Laterality Date  . Colonoscopy  07/31/05    anal canal hemorrhoids, tubular adenomas  . Esophagogastroduodenoscopy  07/31/05    normal esophagus, bulbar and proximal D2 erosions, , path: reflux esophagitis  . Knee surgery  2009    left  . Colonoscopy  09/12/2011    Procedure: COLONOSCOPY;  Surgeon: Daneil Dolin, MD;  [facility];  Service: Endoscopy;  Laterality: N/A;  9:00    There were no vitals filed for this visit.      Subjective Assessment - 12/08/15 0817    Subjective patient arrives reporting that he is having an exacerbation of pain after doing some yardwork this weekend; has also noticed increaed numbness in 4th and 5th digits of L UE. Nothing really changes the numbness. Numbness starts right in his elbow and goes down.    Pertinent History Has had right shoulder bursitis/tendonitis in the past   Currently in Pain? Yes   Pain Score 4    Pain Location Neck   Pain Orientation Posterior   Pain  Descriptors / Indicators Nagging   Pain Type Chronic pain   Pain Radiating Towards dwon into L UE and hand with increased intensity ulnar distribuation    Pain Onset More than a month ago   Pain Frequency Constant   Aggravating Factors  doing a lot of work    Pain Relieving Factors sitting in recliner, lying down    Effect of Pain on Daily Activities just pushes through                          [facility] Adult PT Treatment/Exercise - 12/08/15 0001    Neck Exercises: Machines for Strengthening   UBE (Upper Arm Bike) 5 minutes backwards level 1   unsupervised, not included in billing    Neck Exercises: Theraband   Scapula Retraction 15 reps;Green   Shoulder Extension 15 reps;Green   Rows 15 reps;Green   Neck Exercises: Seated   Neck Retraction 15 reps;15 secs   Neck Retraction Limitations in combination with scap retractions    Shoulder Rolls Backwards;10 reps   Shoulder Rolls Limitations up, back and relax   Other Seated Exercise thoracic and cervical excursions 1x15   Neck Exercises: Prone   Other Prone Exercise prone Ts, Is, Ws, Ys 1x15 with head/neck in neutral position on rolled washcloth  Manual Therapy   Manual Therapy Soft tissue mobilization   Manual therapy comments performed separately from all otehr skilled interventions    Soft tissue mobilization R side posterior upper trap and rhomboids    Neck Exercises: Stretches   Upper Trapezius Stretch 2 reps;30 seconds   Corner Stretch 3 reps;30 seconds                PT Education - 12/08/15 0839    Education provided Yes   Education Details maintain functional posture during all functional work based tasks to assist in reducing radicular symptoms    Person(s) Educated Patient   Methods Explanation   Comprehension Verbalized understanding          PT Short Term Goals - 11/29/15 0833    PT SHORT TERM GOAL #1   Title Pt will express decreased pain from 5/10 to 2/10 to improve pt's ability to  tolerate work load.    Baseline 5/16- patient reports that he has days where he has no pain, and some where he does    Time 3   Period Weeks   Status Partially Met   PT SHORT TERM GOAL #2   Title Pt will demonstrate improved upright posture 50% of the time to improve muscle endurance and reduce pain.    Baseline 5/16- has been working on this, doing OK with it    Time 3   Period Weeks   Status Achieved   PT SHORT TERM GOAL #3   Title Pt will improve AROM cervical rotation in both directions by at least 10*, as well as cervical extension by 10* to promote good posture, and decrease pain.    Baseline 5/16- partially met, extension much improved    Time 3   Period Weeks   Status Partially Met   PT SHORT TERM GOAL #4   Title Pt will be compliant with HEP at least 75% of the time to promote strengthening, and decrease pain    Baseline 5/16- reports doing HEP every day    Time 3   Period Weeks   Status Achieved           PT Long Term Goals - 11/29/15 7209    PT LONG TERM GOAL #1   Title Pt will express decreased frequency of tingling down L UE by 50% to promote return to work and driving.    Baseline 5/16- is not decreasing    Time 6   Period Weeks   Status On-going   PT LONG TERM GOAL #2   Title Pt will express reduction of pain to 0/10 to promote pt's return to normalized workload and activities.    Baseline 5/16- sometimes can get to 0/10, sometimes it stays high    Time 6   Period Weeks   Status Partially Met   PT LONG TERM GOAL #3   Title Pt will demonstrate cervical ROM for B rotation, and extension by 20* to normalize AROM and decrease pain.    Time 6   Period Weeks   Status Partially Met   PT LONG TERM GOAL #4   Title Pt will demonstrate compliance with HEP 80-90% of the time to ensure pt continues to develop increased muscle strengthening, and decrease pain.    Baseline 5/16- doing every day    Time 6   Period Weeks   Status Achieved                Plan - 12/08/15 4709  Clinical Impression Statement Patient continues to present with postural impairments, states taht he has exacerbated pain today due to working more this weekend. Focused on mobility of spine and postural strengthening today, continuing to use green TB per patient tolerance. Continued prone exercises as well and also performed manual due to increased pain today.  Continues to experience decreased neck symptoms with scapular retraction and corrected posture, was educated on importance of good posture throughout daily activities to assist in avoiding exacerbation of symptmos.    Rehab Potential Good   PT Frequency 2x / week   PT Duration 3 weeks   PT Treatment/Interventions Cryotherapy;Electrical Stimulation;Iontophoresis 4mg/ml Dexamethasone;Moist Heat;Traction;Ultrasound;Functional mobility training;Therapeutic activities;Therapeutic exercise;Neuromuscular re-education;Patient/family education;Manual techniques;Passive range of motion;Dry needling;Energy conservation;Taping   PT Next Visit Plan postural training and functional posture exercise with green TB; manual traction if well tolerated.  Add ER with theraband next session. Work on functional lifting with correct posture and scapular retraction.    Consulted and Agree with Plan of Care Patient      Patient will benefit from skilled therapeutic intervention in order to improve the following deficits and impairments:  Decreased endurance, Decreased activity tolerance, Decreased mobility, Decreased range of motion, Decreased strength, Impaired perceived functional ability, Impaired sensation, Increased fascial restricitons, Hypomobility, Improper body mechanics, Postural dysfunction, Pain, Prosthetic Dependency  Visit Diagnosis: Cervicalgia  Abnormal posture  Muscle weakness (generalized)     Problem List Patient Active Problem List   Diagnosis Date Noted  . Adenomatous polyps 08/23/2011  . History of colonic  polyps 08/23/2011  . DERANGEMENT MENISCUS 03/17/2008  . JOINT EFFUSION, KNEE 03/03/2008  . TEAR A C L 03/03/2008    Kristen Unger PT, DPT [phone]  [facility] [address] [place], [place], [address] Phone: [phone]   Fax:  [phone]  Name: Shane Flores MRN: [id number] Date of Birth: 02/28/1961     

## 2015-12-13 ENCOUNTER — Ambulatory Visit (HOSPITAL_COMMUNITY): Payer: Commercial Managed Care - PPO

## 2015-12-13 DIAGNOSIS — R293 Abnormal posture: Secondary | ICD-10-CM

## 2015-12-13 DIAGNOSIS — M6281 Muscle weakness (generalized): Secondary | ICD-10-CM

## 2015-12-13 DIAGNOSIS — M542 Cervicalgia: Secondary | ICD-10-CM | POA: Diagnosis not present

## 2015-12-13 NOTE — Therapy (Signed)
[place], [place], [address] Phone: [phone]   Fax:  [phone]  Physical Therapy Treatment  Patient Details  Name: Shane Flores MRN: [id number] Date of Birth: Oct 30, 1960 Referring Provider: Kary Kos, MD  Encounter Date: 12/13/2015      PT End of Session - 12/13/15 0816    Visit Number 9   Number of Visits 12   Date for PT Re-Evaluation 12/20/15   Authorization Type [employer] UMR   Authorization Time Period 11/07/15 to 12/19/15   PT Start Time 0812   PT Stop Time 0903   PT Time Calculation (min) 51 min   Activity Tolerance Patient tolerated treatment well   Behavior During Therapy [facility] for tasks assessed/performed      Past Medical History  Diagnosis Date  . GERD (gastroesophageal reflux disease)   . HTN (hypertension)   . IBS (irritable bowel syndrome)   . Hemorrhoids   . Colon polyps     Past Surgical History  Procedure Laterality Date  . Colonoscopy  07/31/05    anal canal hemorrhoids, tubular adenomas  . Esophagogastroduodenoscopy  07/31/05    normal esophagus, bulbar and proximal D2 erosions, , path: reflux esophagitis  . Knee surgery  2009    left  . Colonoscopy  09/12/2011    Procedure: COLONOSCOPY;  Surgeon: Daneil Dolin, MD;  [facility];  Service: Endoscopy;  Laterality: N/A;  9:00    There were no vitals filed for this visit.      Subjective Assessment - 12/13/15 0808    Subjective Pt reports no pain today, reports he is a little sore.  Reports compliance with HEP daily.  Reports overall pain has reduced with therapy but continues to have numbness down Lt UE, small imporvements but continues be most difficulty.     Currently in Pain? No/denies   Pain Location Arm   Pain Orientation Left   Pain Descriptors / Indicators Numbness   Pain Radiating Towards Lt elbow down to all fingertips   Pain Onset More than a month ago   Pain Frequency Constant   Aggravating Factors  doing  a lot of work    Pain Relieving Factors sitting in recliner, lying down   Effect of Pain on Daily Activities just pushes through             [facility] Adult PT Treatment/Exercise - 12/13/15 0001    Neck Exercises: Machines for Strengthening   UBE (Upper Arm Bike) 5 minutes backwards level 1   unsupervised no charge   Neck Exercises: Theraband   Scapula Retraction 15 reps;Green   Shoulder Extension 15 reps;Green   Rows 15 reps;Green   Shoulder External Rotation 10 reps;Green   Neck Exercises: Seated   Neck Retraction 15 reps;15 secs   Neck Retraction Limitations in combination with scap retractions    X to V 10 reps   Money 10 reps;3 secs   Shoulder Rolls Backwards;10 reps   Shoulder Rolls Limitations up, back and relax   Neck Exercises: Prone   Shoulder Extension 15 reps  2 sets IR and Er   Rows Limitations 15   Other Prone Exercise Wback 15x 3" with head/neck in neutral position in rolled towels   Manual Therapy   Manual Therapy Soft tissue mobilization   Manual therapy comments performed separately from all otehr skilled interventions    Soft tissue mobilization Bil upper traps, scalenes and levator scapula in supine position with LE  elevated   Manual Traction 2x45s, + occipital release   Neck Exercises: Stretches   Corner Stretch 3 reps;30 seconds            PT Short Term Goals - 11/29/15 0833    PT SHORT TERM GOAL #1   Title Pt will express decreased pain from 5/10 to 2/10 to improve pt's ability to tolerate work load.    Baseline 5/16- patient reports that he has days where he has no pain, and some where he does    Time 3   Period Weeks   Status Partially Met   PT SHORT TERM GOAL #2   Title Pt will demonstrate improved upright posture 50% of the time to improve muscle endurance and reduce pain.    Baseline 5/16- has been working on this, doing OK with it    Time 3   Period Weeks   Status Achieved   PT SHORT TERM GOAL #3   Title Pt will improve AROM cervical  rotation in both directions by at least 10*, as well as cervical extension by 10* to promote good posture, and decrease pain.    Baseline 5/16- partially met, extension much improved    Time 3   Period Weeks   Status Partially Met   PT SHORT TERM GOAL #4   Title Pt will be compliant with HEP at least 75% of the time to promote strengthening, and decrease pain    Baseline 5/16- reports doing HEP every day    Time 3   Period Weeks   Status Achieved           PT Long Term Goals - 11/29/15 0093    PT LONG TERM GOAL #1   Title Pt will express decreased frequency of tingling down L UE by 50% to promote return to work and driving.    Baseline 5/16- is not decreasing    Time 6   Period Weeks   Status On-going   PT LONG TERM GOAL #2   Title Pt will express reduction of pain to 0/10 to promote pt's return to normalized workload and activities.    Baseline 5/16- sometimes can get to 0/10, sometimes it stays high    Time 6   Period Weeks   Status Partially Met   PT LONG TERM GOAL #3   Title Pt will demonstrate cervical ROM for B rotation, and extension by 20* to normalize AROM and decrease pain.    Time 6   Period Weeks   Status Partially Met   PT LONG TERM GOAL #4   Title Pt will demonstrate compliance with HEP 80-90% of the time to ensure pt continues to develop increased muscle strengthening, and decrease pain.    Baseline 5/16- doing every day    Time 6   Period Weeks   Status Achieved               Plan - 12/13/15 8182    Clinical Impression Statement Pt presents this session with forward head, increased IR Bil UE and forward rollled shoulders.  Session focus on education on importance of proper posture to reduce radicular symptoms and overall pain.  Added money and X-to-V to improve posture strengthening and ER, pt reports numbness resolved following new exercise with no reports of symptoms through session.  Manual complete at end of session to reduce tightness with  cervical musculature.     Rehab Potential Good   PT Frequency 2x / week   PT Duration  3 weeks   PT Treatment/Interventions Cryotherapy;Electrical Stimulation;Iontophoresis 70m/ml Dexamethasone;Moist Heat;Traction;Ultrasound;Functional mobility training;Therapeutic activities;Therapeutic exercise;Neuromuscular re-education;Patient/family education;Manual techniques;Passive range of motion;Dry needling;Energy conservation;Taping   PT Next Visit Plan postural training and functional posture exercise with green TB; manual traction if well tolerated.  Work on functional lifting with correct posture and scapular retraction.       Patient will benefit from skilled therapeutic intervention in order to improve the following deficits and impairments:  Decreased endurance, Decreased activity tolerance, Decreased mobility, Decreased range of motion, Decreased strength, Impaired perceived functional ability, Impaired sensation, Increased fascial restricitons, Hypomobility, Improper body mechanics, Postural dysfunction, Pain, Prosthetic Dependency  Visit Diagnosis: Cervicalgia  Abnormal posture  Muscle weakness (generalized)     Problem List Patient Active Problem List   Diagnosis Date Noted  . Adenomatous polyps 08/23/2011  . History of colonic polyps 08/23/2011  . DERANGEMENT MENISCUS 03/17/2008  . JOINT EFFUSION, KNEE 03/03/2008  . TEAR A C L 03/03/2008   CIhor Austin LPTA; [place] CAldona Lento5/30/2017, 9:27 AM  [address] [address]Phone: [phone]  Fax:  [phone] Name: DLAVONTAY KIRKMRN: [id number] of Birth: [birth year]

## 2015-12-15 ENCOUNTER — Encounter (HOSPITAL_COMMUNITY): Payer: Commercial Managed Care - PPO

## 2015-12-15 ENCOUNTER — Telehealth (HOSPITAL_COMMUNITY): Payer: Self-pay

## 2015-12-15 NOTE — Telephone Encounter (Signed)
12/15/15 left message that he was sick

## 2015-12-20 ENCOUNTER — Ambulatory Visit (HOSPITAL_COMMUNITY): Payer: Commercial Managed Care - PPO | Attending: Neurosurgery | Admitting: Physical Therapy

## 2015-12-20 DIAGNOSIS — R293 Abnormal posture: Secondary | ICD-10-CM | POA: Insufficient documentation

## 2015-12-20 DIAGNOSIS — M542 Cervicalgia: Secondary | ICD-10-CM | POA: Insufficient documentation

## 2015-12-20 DIAGNOSIS — M6281 Muscle weakness (generalized): Secondary | ICD-10-CM | POA: Insufficient documentation

## 2015-12-20 NOTE — Patient Instructions (Signed)
  Cervical Rotation with [facility]  1. Place a very slightly inflated beach ball under the cervical spine while in supine (with or without knees bent).  2. Retract chin (I.e., chin tuck).  3. With chin retracted rotate neck to one side.  4. Return.  5. Repeat on other side.  x15 reps  PRONE RETRACTION EXTENSION - PRONE I  Lying face down with your arms by your side, slowly move your arms upward towards the ceiling as you squeeze your shoulder blades downwards and towards your spine.   x15 reps   PRONE W  Lying face down with your elbows bent and palms facing downward, slowly raise your arms up towards the ceiling as you squeeze your shoulder blades downward and towards your spine. x15 reps  PRONE T - BILATERAL - THUMBS UP  Lie face down with your elbow straight and arms out to the side. Next, set your scapula by retracting it towards your spine and downward towards your feet. Then, slowly raise your arms towards the ceiling keeping your elbow straight the entire time as shown.  Your thumbs should be pointed in the upward direction as your arm raises. x15 reps

## 2015-12-20 NOTE — Therapy (Signed)
[place], [place], [address] Phone: [phone]   Fax:  [phone]  Physical Therapy (Discharge) Treatment  Patient Details  Name: Shane Flores MRN: [id number] Date of Birth: [birth year] Referring Provider: Kary Kos   Encounter Date: 12/20/2015      PT End of Session - 12/20/15 0855    Visit Number 10   Number of Visits 10   Authorization Type [employer] UMR   Authorization Time Period 11/07/15 to 12/19/15   PT Start Time 0818   PT Stop Time 0852   PT Time Calculation (min) 34 min   Activity Tolerance Patient tolerated treatment well   Behavior During Therapy [facility] for tasks assessed/performed      Past Medical History  Diagnosis Date  . GERD (gastroesophageal reflux disease)   . HTN (hypertension)   . IBS (irritable bowel syndrome)   . Hemorrhoids   . Colon polyps     Past Surgical History  Procedure Laterality Date  . Colonoscopy  07/31/05    anal canal hemorrhoids, tubular adenomas  . Esophagogastroduodenoscopy  07/31/05    normal esophagus, bulbar and proximal D2 erosions, , path: reflux esophagitis  . Knee surgery  2009    left  . Colonoscopy  09/12/2011    Procedure: COLONOSCOPY;  Surgeon: Daneil Dolin, MD;  Location: [facility];  Service: Endoscopy;  Laterality: N/A;  9:00    There were no vitals filed for this visit.      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 He states that the pain in his neck is better but the numbness is still in his arm. The hardest thing for him to do right now is looking up while working on something as his arm will still go numb. He reports that he has noticed some improvements in ROM in his neck. His most major complaint right now is the numbness in his arm.  Digging with a shovel makes his pain/numbness worste.    Pertinent History  Has had right shoulder bursitis/tendonitis in the past   Currently in Pain? Yes   Pain Score 2    Pain Location Arm   Pain Orientation Left   Pain Descriptors / Indicators Numbness   Pain Type Chronic pain            [facility] PT Assessment - 12/20/15 0001    Assessment   Medical Diagnosis cervicalgia   Referring Provider Kary Kos    Onset Date/Surgical Date --  chronic   Next MD Visit June 13th    Balance Screen   Has the patient fallen in the past 6 months Yes   How many times? 1   Has the patient had a decrease in activity level because of a fear of falling?  No   Is the patient reluctant to leave their home because of a fear of falling?  No   Prior Function   Level of Independence Independent   Vocation Full time employment   Vocation Requirements heavy lifting    Leisure hunting    Observation/Other Assessments   Focus on Therapeutic Outcomes (FOTO)  30% limited    AROM   Cervical Flexion 59   Cervical Extension 22   Cervical - Right Side Bend 46   Cervical - Left Side Bend 43   Cervical - Right Rotation  67   Cervical - Left Rotation 67   Strength   Cervical Flexion 5/5   Cervical Extension 5/5   Cervical - Right Side Bend 5/5   Cervical - Left Side Bend 5/5   Palpation   Palpation comment No trigger points noted, however some muscle tension in upper traps                              PT Education - 12/20/15 0853    Education provided Yes   Education Details Continue to be aware of posture during functional activities at work including driving to reduce pain and radicular symptoms.     Person(s) Educated Patient   Methods Explanation;Handout   Comprehension Verbalized understanding          PT Short Term Goals - 12/20/15 0834    PT SHORT TERM GOAL #1   Title Pt will express decreased pain from 5/10 to 2/10 to improve pt's ability to tolerate work load.    Baseline 5/16- patient reports that he has days where he has no pain, and  some where he does    Time 3   Period Weeks   Status On-going   PT SHORT TERM GOAL #2   Title Pt will demonstrate improved upright posture 50% of the time to improve muscle endurance and reduce pain.    Baseline 5/16- has been working on this, doing OK with it    Time 3   Period Weeks   Status Achieved   PT SHORT TERM GOAL #3   Title Pt will improve AROM cervical rotation in both directions by at least 10*, as well as cervical extension by 10* to promote good posture, and decrease pain.    Baseline 5/16- partially met, extension much improved    Time 3   Period Weeks   Status On-going   PT SHORT TERM GOAL #4   Title Pt will be compliant with HEP at least 75% of the time to promote strengthening, and decrease pain    Baseline 5/16- reports doing HEP every day,  6/6 going well, kids do it with him, and supervising younger crew at work   Time 3   Period Weeks   Status Achieved           PT Long Term Goals - 12/20/15 0837    PT LONG TERM GOAL #1   Title Pt will express decreased frequency of tingling down L UE by 50% to promote return to work and driving.    Baseline 5/16- is not decreasing, 6/6 states he has met the 50%   Time 6   Period Weeks   Status Achieved   PT LONG TERM GOAL #2   Title Pt will express reduction of pain to 0/10 to promote pt's return to normalized workload and activities.    Baseline 5/16- sometimes can get to 0/10, sometimes it stays high, 6/6 still getting up to a 4/10 with heavy lifting.  On average 2/10 just sore   Time 6   Period Weeks   Status On-going   PT LONG TERM GOAL #3   Title Pt will demonstrate cervical ROM for B rotation, and extension by 20* to normalize AROM and decrease pain.    Baseline At evaluation: Cervical extension: 11*, and cervical rotation right: 37*, and cervical rotation left: 38*   Time 6   Period Weeks   Status On-going   PT LONG TERM GOAL #  4   Title Pt will demonstrate compliance with HEP 80-90% of the time to ensure  pt continues to develop increased muscle strengthening, and decrease pain.    Baseline 5/16- doing every day    Time 6   Period Weeks   Status Achieved               Plan - 12/20/15 0857    Clinical Impression Statement Pt continues to demonstrate forward head, and B fwd rolled shoulders, however he expressed that he has caught himself almost every hour correcting his posture.  Pt states that his children have also been assisting with helping him be aware of when he needs to correct his posture.  Pt states that when he does correct his posture the symptoms go away quickly.  At this point, his average pain has reduced down to 2/10 with it increasing with heavy activities.  He has also demonstrated improved strength, most notably with B lateral side bending.  Pt also demonstrated improvements with AROM of lateral side bending.  Pt states he has been doing well with compliance with HEP, and even gets the family involved.  Pt has demonstrated adequate completion of goals at this point, and was given advanced HEP.  At this point, he is d/c from skilled PT services and encouraged to continue with advanced HEP.   Rehab Potential Good   PT Treatment/Interventions Cryotherapy;Electrical Stimulation;Iontophoresis 56m/ml Dexamethasone;Moist Heat;Traction;Ultrasound;Functional mobility training;Therapeutic activities;Therapeutic exercise;Neuromuscular re-education;Patient/family education;Manual techniques;Passive range of motion;Dry needling;Energy conservation;Taping   PT Next Visit Plan d/c today   PT Home Exercise Plan Updated with prone I's, W's, and T's, as well as prone cervical retration with rotation.    Consulted and Agree with Plan of Care Patient      Patient will benefit from skilled therapeutic intervention in order to improve the following deficits and impairments:  Decreased endurance, Decreased activity tolerance, Decreased mobility, Decreased range of motion, Decreased strength,  Impaired perceived functional ability, Impaired sensation, Increased fascial restricitons, Hypomobility, Improper body mechanics, Postural dysfunction, Pain, Prosthetic Dependency  Visit Diagnosis: Cervicalgia - Plan: PT plan of care cert/re-cert  Abnormal posture - Plan: PT plan of care cert/re-cert  Muscle weakness (generalized) - Plan: PT plan of care cert/re-cert     Problem List Patient Active Problem List   Diagnosis Date Noted  . Adenomatous polyps 08/23/2011  . History of colonic polyps 08/23/2011  . DERANGEMENT MENISCUS 03/17/2008  . JOINT EFFUSION, KNEE 03/03/2008  . TEAR A C L 03/03/2008   PHYSICAL THERAPY DISCHARGE SUMMARY  Visits from Start of Care: 10  Current functional level related to goals / functional outcomes: Pt has demonstrated decrease in average pain and improvements with both cervical strength and range of motion.  He was educated on exercises, as well as becoming more aware of postural abnormalities and ability to correct.    Remaining deficits: Pt continues to demonstrate decreased B cervical rotation, as well as increased pain with heavy lifting activities.  Pt continues to self-correct his posture on average - every hour.     Education / Equipment: Pt was given an HEP to correct strength deficits, as well as improve cervical AROM.  Pt has been given theraband during previous treatments and was encouraged to continue utilizing this with his HEP.   Plan: Patient agrees to discharge.  Patient goals were partially met. Patient is being discharged due to being pleased with the current functional level.  ?????       KDeniece ReePT, DPT [phone]  Ladoris Gene, PT, [place] [address] [place], [place], [address] Phone: [phone]   Fax:  [phone]  Name: Shane Flores MRN: [id number] Date of Birth: 1960/11/19

## 2015-12-22 ENCOUNTER — Encounter (HOSPITAL_COMMUNITY): Payer: Commercial Managed Care - PPO | Admitting: Physical Therapy

## 2016-07-17 ENCOUNTER — Encounter: Payer: Self-pay | Admitting: Internal Medicine

## 2016-09-01 IMAGING — MR MR CERVICAL SPINE W/O CM
4 of 5 series · 15 of 48 positions shown · non-contrast
Comparison: None.

CLINICAL DATA: Cervical radiculitis.  Left arm pain and numbness.

EXAM:
MRI CERVICAL SPINE WITHOUT CONTRAST
TECHNIQUE: Multiplanar, multisequence MR imaging of the cervical spine was
performed. No intravenous contrast was administered.

[Series 3: T2 · sagittal · 3.0mm · 0.47mm/px · 6 of 13 slices shown (1 of 2)]
[im 1/13]
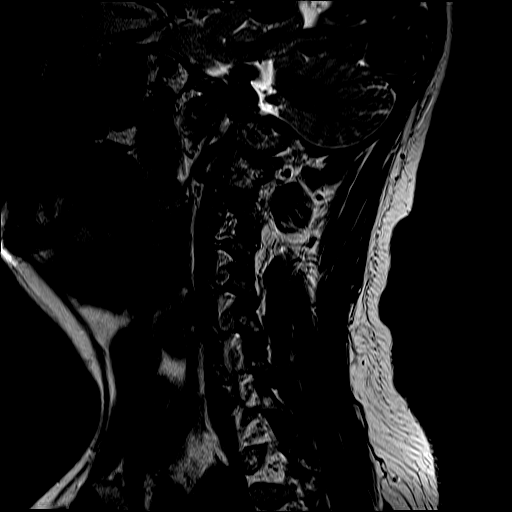
[im 3/13]
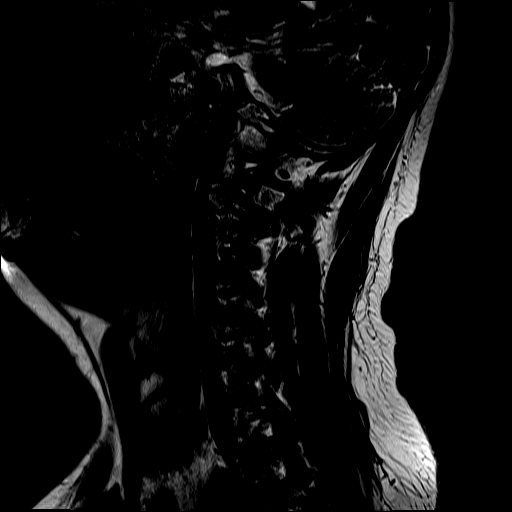
[im 5/13]
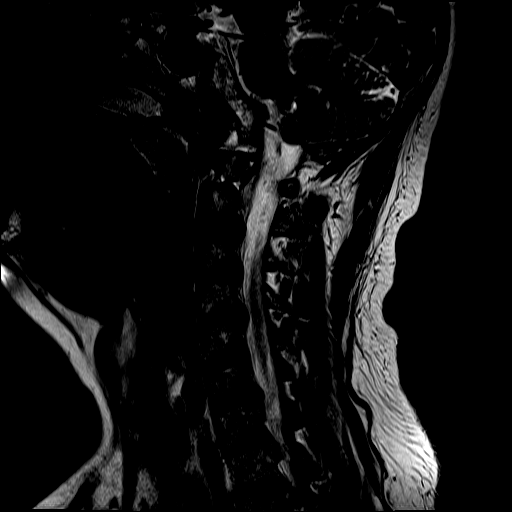
[im 8/13]
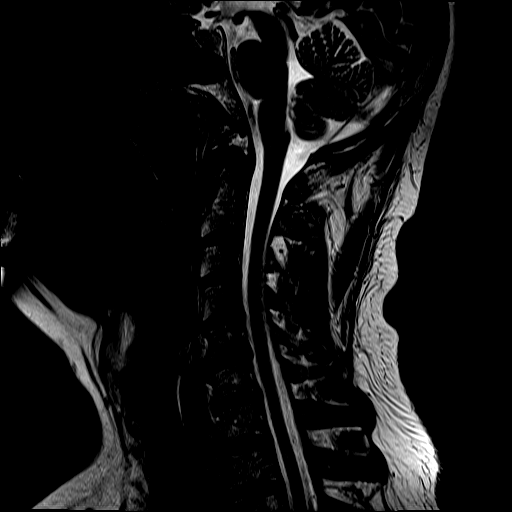
[im 10/13]
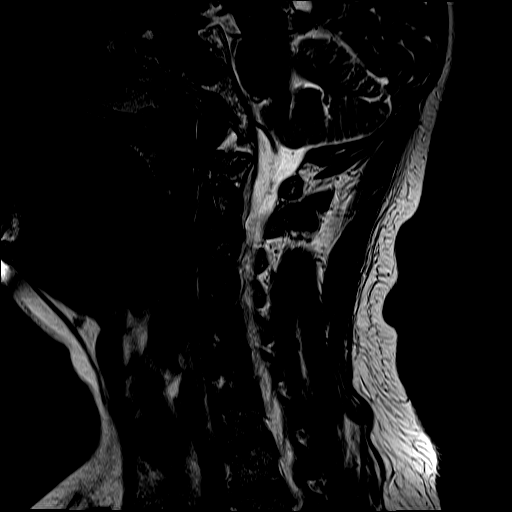
[im 13/13]
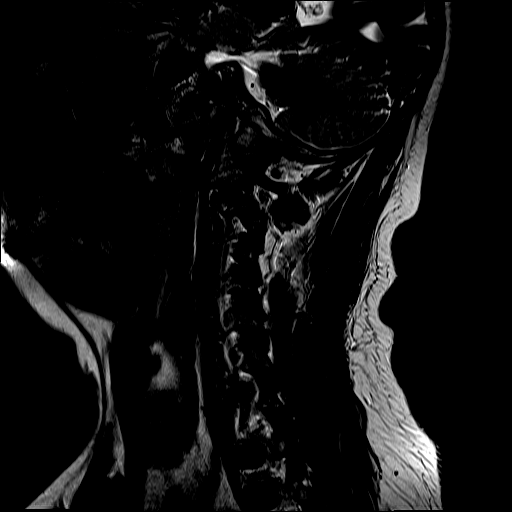

[Series 4: FLAIR · sagittal · 3.0mm · 0.49mm/px · 3 of 13 slices shown]
[im 1/13]
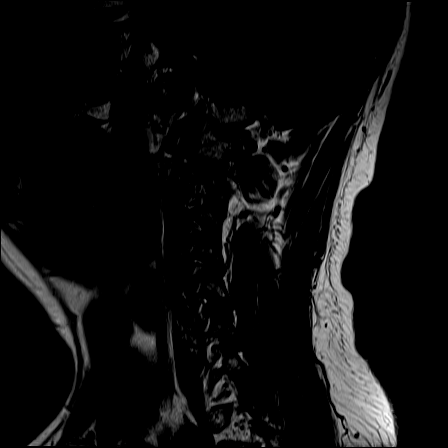
[im 7/13]
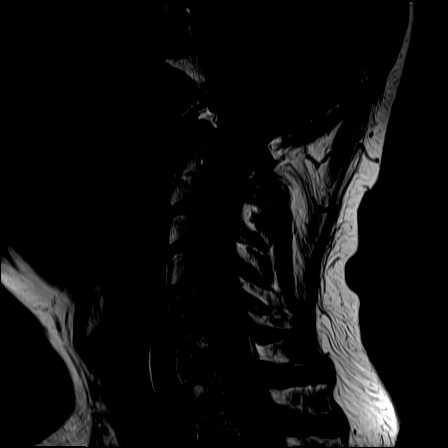
[im 13/13]
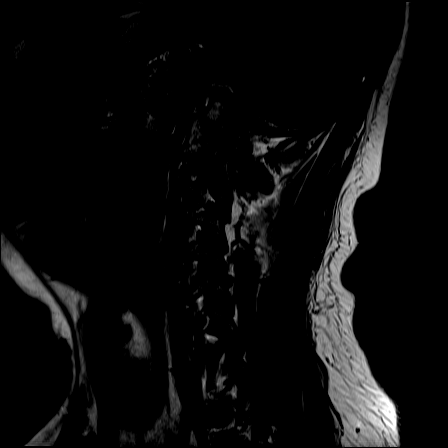

[Series 5: ir sagital · sagittal · 3.0mm · 0.24mm/px · 3 of 13 slices shown]
[im 1/13]
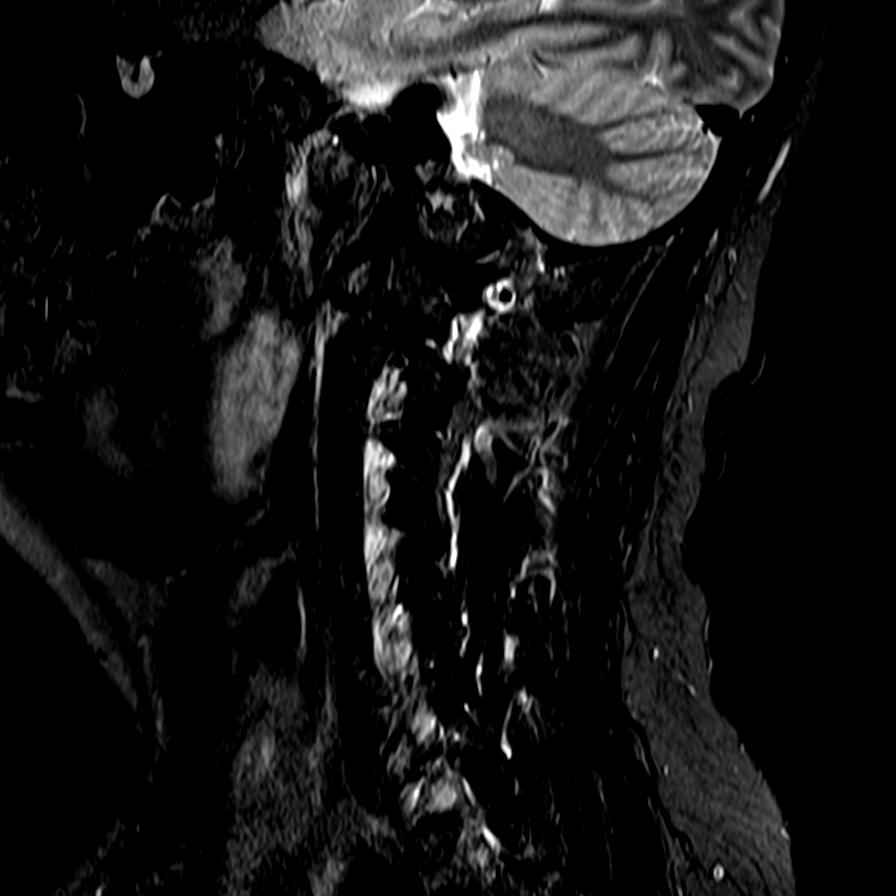
[im 7/13]
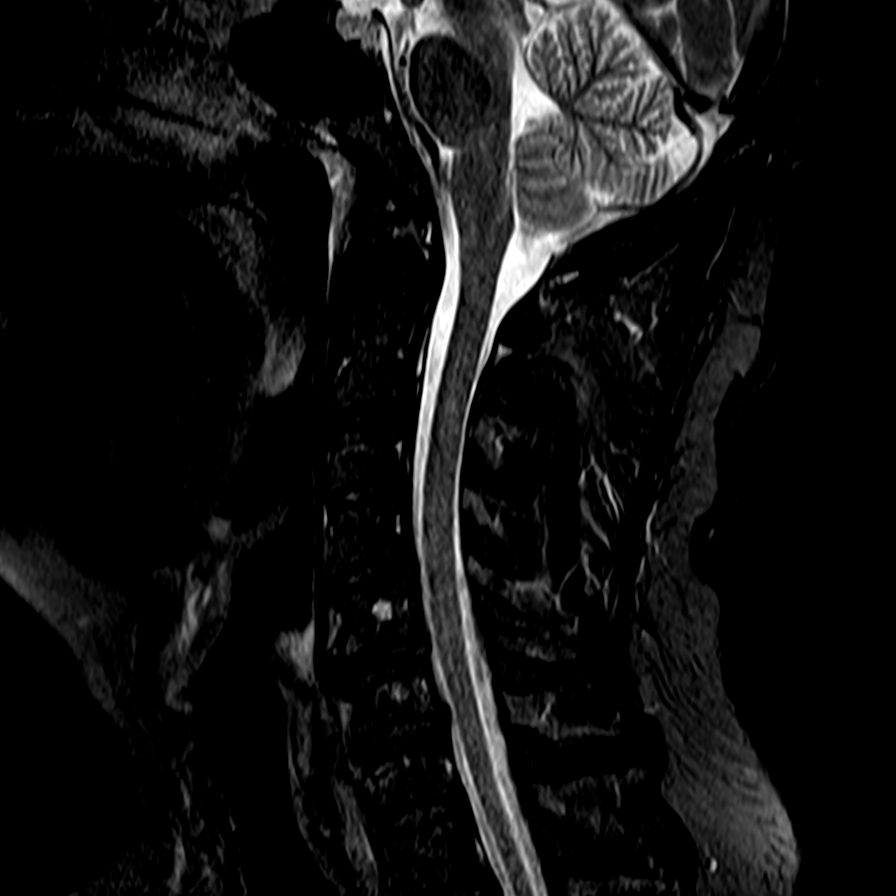
[im 13/13]
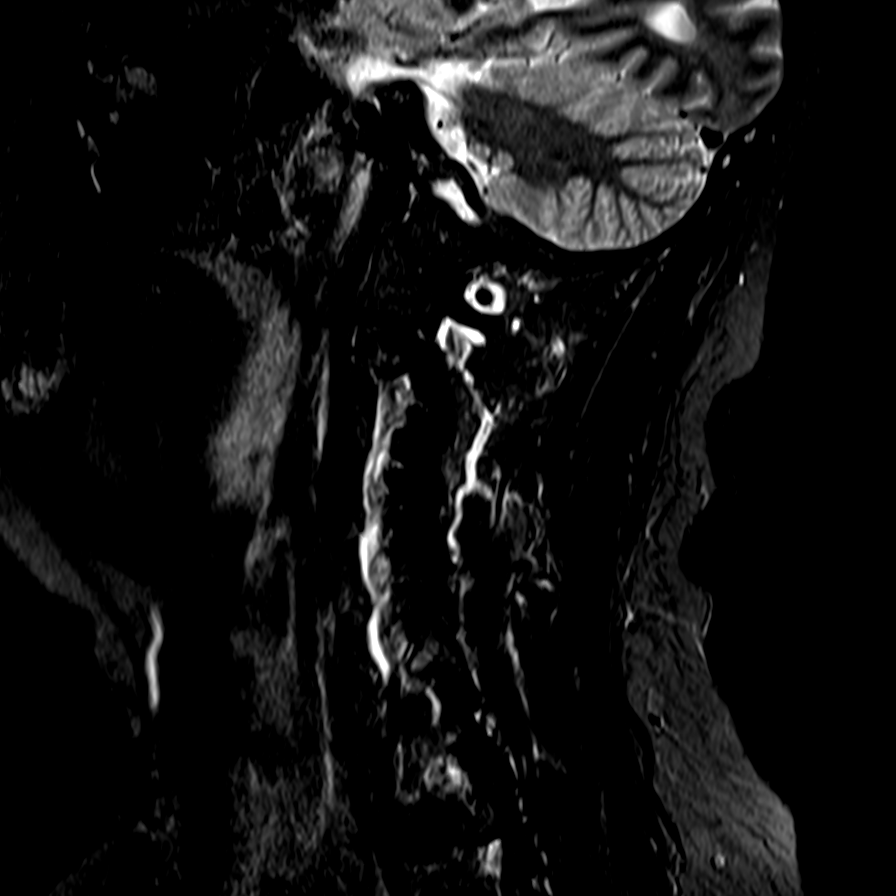

[Series 7: T2 · axial · 3.0mm · 0.19mm/px · z∈[-69,+21]mm · 3 of 40 slices shown (2 of 2)]
[im 6/40]
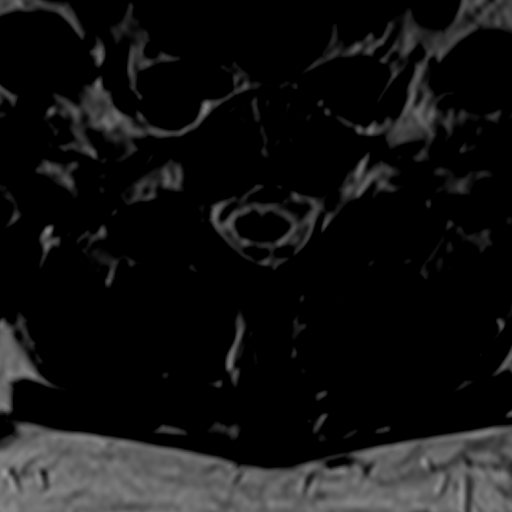
[im 21/40]
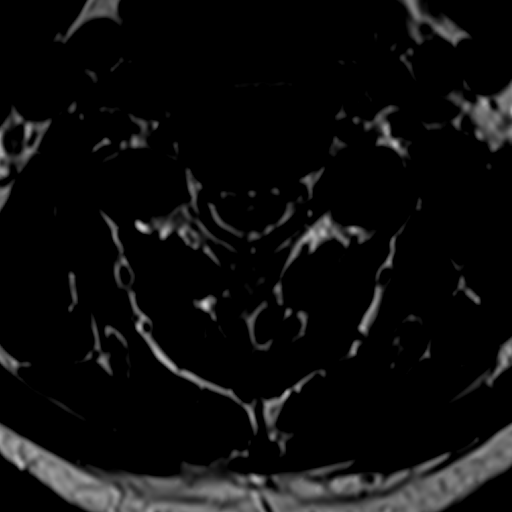
[im 34/40]
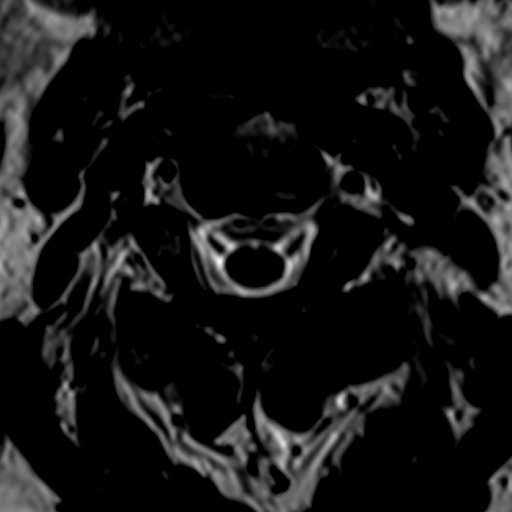

[15 of 48 positions shown; findings below may reference images not displayed]

FINDINGS: Image quality degraded by mild motion.

Normal cervical alignment. Negative for fracture. No mass or edema.

Negative for cord compression. Negative cord lesion. Cervical
medullary junction normal.

C2-3:  Mild right foraminal narrowing due to uncinate spur.

C3-4: Right foraminal narrowing due to uncinate spurring and mild
facet degeneration.

C4-5: Disc degeneration and mild uncinate spurring. Mild facet
degeneration. Mild spinal and mild foraminal stenosis bilaterally.

C5-6: Mild disc and mild facet degeneration without significant
stenosis.

C6-7: Mild disc degeneration and spondylosis. Mild foraminal
narrowing bilaterally due to spurring.

C7-T1:  Negative
IMPRESSION: Chronic degenerative changes in the cervical spine. No acute
abnormality.

Mild right foraminal narrowing C2-3 and C3-4 due to spurring.

Mild spinal and mild biforaminal stenosis at C4-5

Mild foraminal narrowing bilaterally C6-7.

## 2018-12-24 ENCOUNTER — Encounter: Payer: Self-pay | Admitting: Internal Medicine

## 2019-02-16 ENCOUNTER — Ambulatory Visit: Payer: Commercial Managed Care - PPO | Admitting: Gastroenterology

## 2019-04-06 ENCOUNTER — Ambulatory Visit: Payer: Self-pay | Admitting: Gastroenterology

## 2019-04-06 ENCOUNTER — Encounter: Payer: Self-pay | Admitting: Gastroenterology

## 2020-10-12 ENCOUNTER — Encounter: Payer: Self-pay | Admitting: Internal Medicine

## 2020-11-01 ENCOUNTER — Other Ambulatory Visit: Payer: Self-pay

## 2020-11-01 ENCOUNTER — Ambulatory Visit (INDEPENDENT_AMBULATORY_CARE_PROVIDER_SITE_OTHER): Payer: Self-pay | Admitting: *Deleted

## 2020-11-01 VITALS — Ht 72.0 in | Wt 265.8 lb

## 2020-11-01 DIAGNOSIS — Z86010 Personal history of colonic polyps: Secondary | ICD-10-CM

## 2020-11-01 MED ORDER — NA SULFATE-K SULFATE-MG SULF 17.5-3.13-1.6 GM/177ML PO SOLN: 1.0000 | Freq: Once | ORAL | 0 refills | Status: AC

## 2020-11-01 NOTE — Patient Instructions (Addendum)
Shane Flores  October 12, 1960 MRN: [id number]    Covid test: 01/09/2021 at 3:45 PM.   Procedure Date: 01/11/2021 Time to register: 6:30 AM Place to register: [facility] Procedure Time: 7:30 AM Scheduled provider: Dr. Gala Romney    PREPARATION FOR COLONOSCOPY WITH SUPREP BOWEL PREP KIT  Note: Suprep Bowel Prep Kit is a split-dose (2day) regimen. Consumption of BOTH 6-ounce bottles is required for a complete prep.  Please notify us immediately if you are diabetic, take iron supplements, or if you are on Coumadin or any other blood thinners.  Please hold the following medications: n/a                                                                                                                                                  2 DAYS BEFORE PROCEDURE:  DATE: 01/09/2021   DAY: Monday Begin clear liquid diet AFTER your lunch meal. NO SOLID FOODS after this point.  1 DAY BEFORE PROCEDURE:  DATE: 01/10/2021  DAY: Tuesday Continue clear liquids the entire day - NO SOLID FOOD.   Diabetic medications adjustments for today: n/a  At 8:00am: Complete steps 1 through 4 below, using ONE (1) 6-ounce bottle, before going to bed. Step 1:  Pour ONE (1) 6-ounce bottle of SUPREP liquid into the mixing container.  Step 2:  Add cool drinking water to the 16 ounce line on the container and mix.  Note: Dilute the solution concentrate as directed prior to use. Step 3:  DRINK ALL the liquid in the container. Step 4:  You MUST drink an additional two (2) or more 16 ounce containers of water over the next one (1) hour.   At 6:00pm: Complete steps 1 through 4 below, using ONE (1) 6-ounce bottle, before going to bed. Step 1:  Pour ONE (1) 6-ounce bottle of SUPREP liquid into the mixing container.  Step 2:  Add cool drinking water to the 16 ounce line on the container and mix.  Note: Dilute the solution concentrate as directed prior to use. Step 3:  DRINK ALL the liquid in the container. Step 4:  You MUST  drink an additional two (2) or more 16 ounce containers of water over the next one (1) hour.   Continue clear liquids until midnight.  Nothing by mouth after midnight.    DAY OF PROCEDURE:   DATE: 01/11/2021   DAY: Wednesday If you take medications for your heart, blood pressure, or breathing, you may take these medications.  Diabetic medications adjustments for today: n/a  You may take your morning medications with sip of water unless we have instructed otherwise.    Please see below for Dietary Information.  CLEAR LIQUIDS INCLUDE:  Water Jello (NOT red in color)   Ice Popsicles (NOT red in color)   Tea (sugar ok, no milk/cream) Powdered fruit flavored drinks  Coffee (sugar ok, no milk/cream) Gatorade/ Lemonade/ Kool-Aid  (NOT red in color)   Juice: apple, white grape, white cranberry Soft drinks  Clear bullion, consomme, broth (fat free beef/chicken/vegetable)  Carbonated beverages (any kind)  Strained chicken noodle soup Hard Candy   Remember: Clear liquids are liquids that will allow you to see your fingers on the other side of a clear glass. Be sure liquids are NOT red in color, and not cloudy, but CLEAR.  DO NOT EAT OR DRINK ANY OF THE FOLLOWING:  Dairy products of any kind   Cranberry juice Tomato juice / V8 juice   Grapefruit juice Orange juice     Red grape juice  Do not eat any solid foods, including such foods as: cereal, oatmeal, yogurt, fruits, vegetables, creamed soups, eggs, bread, crackers, pureed foods in a blender, etc.   HELPFUL HINTS FOR DRINKING PREP SOLUTION:   Make sure prep is extremely cold. Mix and refrigerate the the morning of the prep. You may also put in the freezer.   You may try mixing some Crystal Light or Country Time Lemonade if you prefer. Mix in small amounts; add more if necessary.  Try drinking through a straw  Rinse mouth with water or a mouthwash between glasses, to remove after-taste.  Try sipping on a cold beverage /ice/ popsicles  between glasses of prep.  Place a piece of sugar-free hard candy in mouth between glasses.  If you become nauseated, try consuming smaller amounts, or stretch out the time between glasses. Stop for 30-60 minutes, then slowly start back drinking.     OTHER INSTRUCTIONS  You will need a responsible adult at least 59 years of age to accompany you and drive you home. This person must remain in the waiting room during your procedure. The hospital will cancel your procedure if you do not have a responsible adult with you.   1. Wear loose fitting clothing that is easily removed. 2. Leave jewelry and other valuables at home.  3. Remove all body piercing jewelry and leave at home. 4. Total time from sign-in until discharge is approximately 2-3 hours. 5. You should go home directly after your procedure and rest. You can resume normal activities the day after your procedure. 6. The day of your procedure you should not:  Drive  Make legal decisions  Operate machinery  Drink alcohol  Return to work   You may call the office (Dept: [phone]) before 5:00pm, or page the doctor on call [phone]) after 5:00pm, for further instructions, if necessary.   Insurance Information YOU WILL NEED TO CHECK WITH YOUR INSURANCE COMPANY FOR THE BENEFITS OF COVERAGE YOU HAVE FOR THIS PROCEDURE.  UNFORTUNATELY, NOT ALL INSURANCE COMPANIES HAVE BENEFITS TO COVER ALL OR PART OF THESE TYPES OF PROCEDURES.  IT IS YOUR RESPONSIBILITY TO CHECK YOUR BENEFITS, HOWEVER, WE WILL BE GLAD TO ASSIST YOU WITH ANY CODES YOUR INSURANCE COMPANY MAY NEED.    PLEASE NOTE THAT MOST INSURANCE COMPANIES WILL NOT COVER A SCREENING COLONOSCOPY FOR PEOPLE UNDER THE AGE OF 50  IF YOU HAVE BCBS INSURANCE, YOU MAY HAVE BENEFITS FOR A SCREENING COLONOSCOPY BUT IF POLYPS ARE FOUND THE DIAGNOSIS WILL CHANGE AND THEN YOU MAY HAVE A DEDUCTIBLE THAT WILL NEED TO BE MET. SO PLEASE MAKE SURE YOU CHECK YOUR BENEFITS FOR A SCREENING  COLONOSCOPY AS WELL AS A DIAGNOSTIC COLONOSCOPY.

## 2020-11-01 NOTE — Progress Notes (Addendum)
Gastroenterology Pre-Procedure Review  Request Date: 11/01/2020 Requesting Physician: Dr. Willey Blade, Last TCS 2013 done by Dr. Gala Romney, colonic diverticulosis-sigmoid, hx adenoma in 2007  PATIENT REVIEW QUESTIONS: The patient responded to the following health history questions as indicated:    1. Diabetes Melitis: no 2. Joint replacements in the past 12 months: no 3. Major health problems in the past 3 months: no 4. Has an artificial valve or MVP: no 5. Has a defibrillator: no 6. Has been advised in past to take antibiotics in advance of a procedure like teeth cleaning: no 7. Family history of colon cancer: no 8. Alcohol Use: no 9. Illicit drug Use: no 10. History of sleep apnea: no 11. History of coronary artery or other vascular stents placed within the last 12 months: no 12. History of any prior anesthesia complications: no 13. Body mass index is 36.05 kg/m.    MEDICATIONS & ALLERGIES:    Patient reports the following regarding taking any blood thinners:   Plavix? no Aspirin? no Coumadin? no Brilinta? no Xarelto? no Eliquis? no Pradaxa? no Savaysa? no Effient? no  Patient confirms/reports the following medications:  Current Outpatient Medications  Medication Sig Dispense Refill  . amLODipine (NORVASC) 10 MG tablet Take 1 tablet by mouth daily.    . benazepril (LOTENSIN) 20 MG tablet Take 20 mg by mouth daily.     No current facility-administered medications for this visit.    Patient confirms/reports the following allergies:  No Known Allergies  No orders of the defined types were placed in this encounter.   AUTHORIZATION INFORMATION Primary Insurance: [place],  [place] #:[id number] Pre-Cert / Auth required: No, not required Pre-Cert / Auth #: REF#: [id number] [id number] 11/16/2020  SCHEDULE INFORMATION: Procedure has been scheduled as follows:  Date: 01/11/2021 , Time: 7:30 Location: [facility] with Dr. Gala Romney  This Gastroenterology Pre-Precedure Review Form is being routed to the following  provider(s): Neil Crouch, PA

## 2020-11-14 NOTE — Progress Notes (Signed)
Ok to schedule conscious sedation. ASA II.  °

## 2020-11-16 ENCOUNTER — Other Ambulatory Visit: Payer: Self-pay | Admitting: *Deleted

## 2021-01-09 ENCOUNTER — Other Ambulatory Visit (HOSPITAL_COMMUNITY): Payer: PRIVATE HEALTH INSURANCE

## 2021-01-11 ENCOUNTER — Other Ambulatory Visit: Payer: Self-pay

## 2021-01-11 ENCOUNTER — Encounter (HOSPITAL_COMMUNITY): Payer: Self-pay | Admitting: Internal Medicine

## 2021-01-11 ENCOUNTER — Encounter (HOSPITAL_COMMUNITY)
Admission: RE | Disposition: A | Discharge status: Home / Self Care | Payer: Self-pay | Source: Ambulatory Visit | Attending: Internal Medicine

## 2021-01-11 ENCOUNTER — Ambulatory Visit (HOSPITAL_COMMUNITY)
Admission: RE | Admit: 2021-01-11 | Discharge: 2021-01-11 | Disposition: A | Discharge status: Home / Self Care | Payer: No Typology Code available for payment source | Source: Ambulatory Visit | Attending: Internal Medicine | Admitting: Internal Medicine

## 2021-01-11 DIAGNOSIS — Z1211 Encounter for screening for malignant neoplasm of colon: Secondary | ICD-10-CM | POA: Insufficient documentation

## 2021-01-11 DIAGNOSIS — K5730 Diverticulosis of large intestine without perforation or abscess without bleeding: Secondary | ICD-10-CM | POA: Diagnosis not present

## 2021-01-11 DIAGNOSIS — Z79899 Other long term (current) drug therapy: Secondary | ICD-10-CM | POA: Insufficient documentation

## 2021-01-11 DIAGNOSIS — K635 Polyp of colon: Secondary | ICD-10-CM | POA: Diagnosis not present

## 2021-01-11 DIAGNOSIS — Z86010 Personal history of colonic polyps: Secondary | ICD-10-CM

## 2021-01-11 DIAGNOSIS — F17210 Nicotine dependence, cigarettes, uncomplicated: Secondary | ICD-10-CM | POA: Insufficient documentation

## 2021-01-11 HISTORY — PX: COLONOSCOPY: SHX5424

## 2021-01-11 HISTORY — PX: POLYPECTOMY: SHX5525

## 2021-01-11 SURGERY — COLONOSCOPY
Anesthesia: Moderate Sedation

## 2021-01-11 MED ORDER — ONDANSETRON HCL 4 MG/2ML IJ SOLN
INTRAMUSCULAR | Status: AC
  Filled 2021-01-11: qty 2

## 2021-01-11 MED ORDER — ONDANSETRON HCL 4 MG/2ML IJ SOLN
INTRAMUSCULAR | Status: DC | PRN
  Administered 2021-01-11: 4 mg via INTRAVENOUS

## 2021-01-11 MED ORDER — MIDAZOLAM HCL 5 MG/5ML IJ SOLN
INTRAMUSCULAR | Status: DC | PRN
  Administered 2021-01-11 (×2): 1 mg via INTRAVENOUS
  Administered 2021-01-11 (×2): 2 mg via INTRAVENOUS
  Administered 2021-01-11: 1 mg via INTRAVENOUS

## 2021-01-11 MED ORDER — MEPERIDINE HCL 50 MG/ML IJ SOLN
INTRAMUSCULAR | Status: AC
  Filled 2021-01-11: qty 1

## 2021-01-11 MED ORDER — MEPERIDINE HCL 100 MG/ML IJ SOLN
INTRAMUSCULAR | Status: DC | PRN
  Administered 2021-01-11: 40 mg via INTRAVENOUS
  Administered 2021-01-11: 10 mg via INTRAVENOUS

## 2021-01-11 MED ORDER — SODIUM CHLORIDE 0.9 % IV SOLN: INTRAVENOUS | Status: DC

## 2021-01-11 MED ORDER — STERILE WATER FOR IRRIGATION IR SOLN
Status: DC | PRN
  Administered 2021-01-11: 2.5 mL

## 2021-01-11 MED ORDER — MIDAZOLAM HCL 5 MG/5ML IJ SOLN
INTRAMUSCULAR | Status: AC
  Filled 2021-01-11: qty 10

## 2021-01-11 NOTE — H&P (Signed)
@LOGO @   Primary Care Physician:  Asencion Noble, MD Primary Gastroenterologist:  Dr. Alfonso Patten  Pre-Procedure History & Physical: HPI:  Shane Flores is a 60 y.o. male here for surveillance colonoscopy.  History of colonic adenoma removed 2007; negative colonoscopy (diverticulosis) 2013.   Past Medical History:  Diagnosis Date   Colon polyps    GERD (gastroesophageal reflux disease)    Hemorrhoids    HTN (hypertension)    IBS (irritable bowel syndrome)     Past Surgical History:  Procedure Laterality Date   COLONOSCOPY  07/31/05   anal canal hemorrhoids, tubular adenomas   COLONOSCOPY  09/12/2011   Procedure: COLONOSCOPY;  Surgeon: Daneil Dolin, MD;  Location: [facility];  Service: Endoscopy;  Laterality: N/A;  9:00   ESOPHAGOGASTRODUODENOSCOPY  07/31/05   normal esophagus, bulbar and proximal D2 erosions, , path: reflux esophagitis   KNEE SURGERY  2009   left    Prior to Admission medications   Medication Sig Start Date End Date Taking? Authorizing Provider  benazepril (LOTENSIN) 20 MG tablet Take 20 mg by mouth daily. 10/24/20  Yes [provider]  amLODipine (NORVASC) 10 MG tablet Take 1 tablet by mouth daily. 10/24/20   [provider]    Allergies as of 11/14/2020   (No Known Allergies)    Family History  Problem Relation Age of Onset   Colon cancer Neg Hx     Social History   Socioeconomic History   Marital status: Married    Spouse name: Not on file   Number of children: Not on file   Years of education: Not on file   Highest education level: Not on file  Occupational History   Occupation: Engineer, building services  Tobacco Use   Smoking status: Every Day    Packs/day: 1.00    Years: 30.00    Pack years: 30.00    Types: Cigarettes   Smokeless tobacco: Never  Vaping Use   Vaping Use: Never used  Substance and Sexual Activity   Alcohol use: No   Drug use: No   Sexual activity: Not on file  Other Topics Concern   Not on file  Social History  Narrative   Not on file   Social Determinants of Health   Financial Resource Strain: Not on file  Food Insecurity: Not on file  Transportation Needs: Not on file  Physical Activity: Not on file  Stress: Not on file  Social Connections: Not on file  Intimate Partner Violence: Not on file    Review of Systems: See HPI, otherwise negative ROS  Physical Exam: BP (!) 135/55   Pulse 95   Temp 97.7 F (36.5 C) (Oral)   Resp 18   Ht 6' (1.829 m)   Wt 117.9 kg   SpO2 95%   BMI 35.26 kg/m  General:   Alert,  Well-developed, well-nourished, pleasant and cooperative in NAD SNeck:  Supple; no masses or thyromegaly. No significant cervical adenopathy. Lungs:  Clear throughout to auscultation.   No wheezes, crackles, or rhonchi. No acute distress. Heart:  Regular rate and rhythm; no murmurs, clicks, rubs,  or gallops. Abdomen: Non-distended, normal bowel sounds.  Soft and nontender without appreciable mass or hepatosplenomegaly.  Pulses:  Normal pulses noted. Extremities:  Without clubbing or edema.  Impression/Plan: 60 year old gentleman with a history of colonic adenoma here for surveillance colonoscopy per plan.  The risks, benefits, limitations, alternatives and imponderables have been reviewed with the patient. Questions have been answered. All parties are  agreeable.      Notice: This dictation was prepared with Dragon dictation along with smaller phrase technology. Any transcriptional errors that result from this process are unintentional and may not be corrected upon review.

## 2021-01-11 NOTE — Op Note (Signed)
[facility] Patient Name: Shane Flores Procedure Date: 01/11/2021 7:20 AM MRN: [id number] Date of Birth: 24-Jan-1961 Attending MD: Norvel Richards , MD CSN: [id number] Age: 60 Admit Type: Outpatient Procedure:                Colonoscopy Indications:              High risk colon cancer surveillance: Personal                            history of colonic polyps Providers:                Norvel Richards, MD, Lurline Del, RN, Nelma Rothman, Technician Referring MD:             Asencion Noble Medicines:                Midazolam 7 mg IV, Meperidine 50 mg IV Complications:            No immediate complications. Estimated Blood Loss:     Estimated blood loss was minimal. Procedure:                After obtaining informed consent, the colonoscope                            was passed under direct vision. Throughout the                            procedure, the patient's blood pressure, pulse, and                            oxygen saturations were monitored continuously. The                            CF-HQ190L ([id number]) scope was introduced through                            the anus and advanced to the the cecum, identified                            by appendiceal orifice and ileocecal valve. Scope In: 7:50:21 AM Scope Out: 8:16:37 AM Scope Withdrawal Time: 0 hours 16 minutes 20 seconds  Total Procedure Duration: 0 hours 26 minutes 16 seconds  Findings:      The perianal and digital rectal examinations were normal.      Scattered medium-mouthed diverticula were found in the sigmoid colon.      Five sessile polyps were found in the cecum. The polyps were 4 to 7 mm       in size. They were separate lesions. They all appeared to be slightly       centrally depressed. See multiple photographs. They were soft. These       polyps were removed with a cold snare. Resection and retrieval were       complete. Estimated blood loss was minimal. Impression:                -  Diverticulosis in the sigmoid colon.                           - Five 4 to 7 mm polyps in the cecum, removed with                            a cold snare. Resected and retrieved. Moderate Sedation:      Moderate (conscious) sedation was administered by the endoscopy nurse       and supervised by the endoscopist. The following parameters were       monitored: oxygen saturation, heart rate, blood pressure, respiratory       rate, EKG, adequacy of pulmonary ventilation, and response to care.       Total physician intraservice time was 33 minutes. Recommendation:           - Patient has a contact number available for                            emergencies. The signs and symptoms of potential                            delayed complications were discussed with the                            patient. Return to normal activities tomorrow.                            Written discharge instructions were provided to the                            patient.                           - Advance diet as tolerated. Procedure Code(s):        --- Professional ---                           [phone], Colonoscopy, flexible; with removal of                            tumor(s), polyp(s), or other lesion(s) by snare                            technique                           99153, Moderate sedation; each additional 15                            minutes intraservice time                           G0500, Moderate sedation services provided by the                            same physician or other qualified health care  professional performing a gastrointestinal                            endoscopic service that sedation supports,                            requiring the presence of an independent trained                            observer to assist in the monitoring of the                            patient's level of consciousness and physiological                            status;  initial 15 minutes of intra-service time;                            patient age 49 years or older (additional time may                            be reported with [phone], as appropriate) Diagnosis Code(s):        --- Professional ---                           Z86.010, Personal history of colonic polyps                           K63.5, Polyp of colon                           K57.30, Diverticulosis of large intestine without                            perforation or abscess without bleeding CPT copyright 2019 [employer]. All rights reserved. The codes documented in this report are preliminary and upon coder review may  be revised to meet current compliance requirements. Cristopher Estimable. Shereta Crothers, MD Norvel Richards, MD 01/11/2021 8:25:28 AM This report has been signed electronically. Number of Addenda: 0

## 2021-01-11 NOTE — Discharge Instructions (Signed)
  Colonoscopy Discharge Instructions  Read the instructions outlined below and refer to this sheet in the next few weeks. These discharge instructions provide you with general information on caring for yourself after you leave the hospital. Your doctor may also give you specific instructions. While your treatment has been planned according to the most current medical practices available, unavoidable complications occasionally occur. If you have any problems or questions after discharge, call Dr. Gala Romney at [phone]. ACTIVITY You may resume your regular activity, but move at a slower pace for the next 24 hours.  Take frequent rest periods for the next 24 hours.  Walking will help get rid of the air and reduce the bloated feeling in your belly (abdomen).  No driving for 24 hours (because of the medicine (anesthesia) used during the test).   Do not sign any important legal documents or operate any machinery for 24 hours (because of the anesthesia used during the test).  NUTRITION Drink plenty of fluids.  You may resume your normal diet as instructed by your doctor.  Begin with a light meal and progress to your normal diet. Heavy or fried foods are harder to digest and may make you feel sick to your stomach (nauseated).  Avoid alcoholic beverages for 24 hours or as instructed.  MEDICATIONS You may resume your normal medications unless your doctor tells you otherwise.  WHAT YOU CAN EXPECT TODAY Some feelings of bloating in the abdomen.  Passage of more gas than usual.  Spotting of blood in your stool or on the toilet paper.  IF YOU HAD POLYPS REMOVED DURING THE COLONOSCOPY: No aspirin products for 7 days or as instructed.  No alcohol for 7 days or as instructed.  Eat a soft diet for the next 24 hours.  FINDING OUT THE RESULTS OF YOUR TEST Not all test results are available during your visit. If your test results are not back during the visit, make an appointment with your caregiver to find out the  results. Do not assume everything is normal if you have not heard from your caregiver or the medical facility. It is important for you to follow up on all of your test results.  SEEK IMMEDIATE MEDICAL ATTENTION IF: You have more than a spotting of blood in your stool.  Your belly is swollen (abdominal distention).  You are nauseated or vomiting.  You have a temperature over 101.  You have abdominal pain or discomfort that is severe or gets worse throughout the day.   5 polyps removed from your colon today  Colon polyp and diverticulosis information provided  Further recommendations to follow pending review of pathology report port  At patient request, called Brooke at [phone]-no answer.

## 2021-01-13 LAB — SURGICAL PATHOLOGY

## 2021-01-14 ENCOUNTER — Encounter: Payer: Self-pay | Admitting: Internal Medicine

## 2021-01-18 ENCOUNTER — Encounter (HOSPITAL_COMMUNITY): Payer: Self-pay | Admitting: Internal Medicine
